# Patient Record
Sex: Female | Born: 1935 | Race: Black or African American | Hispanic: No
Health system: Southern US, Community
[De-identification: ages and names within clinical notes are randomized; demographics above are authoritative.]

## PROBLEM LIST (undated history)

## (undated) DIAGNOSIS — I639 Cerebral infarction, unspecified: Secondary | ICD-10-CM

## (undated) DIAGNOSIS — E041 Nontoxic single thyroid nodule: Secondary | ICD-10-CM

## (undated) DIAGNOSIS — I872 Venous insufficiency (chronic) (peripheral): Secondary | ICD-10-CM

## (undated) DIAGNOSIS — M810 Age-related osteoporosis without current pathological fracture: Secondary | ICD-10-CM

## (undated) DIAGNOSIS — S72001A Fracture of unspecified part of neck of right femur, initial encounter for closed fracture: Secondary | ICD-10-CM

## (undated) DIAGNOSIS — D0590 Unspecified type of carcinoma in situ of unspecified breast: Secondary | ICD-10-CM

## (undated) DIAGNOSIS — N289 Disorder of kidney and ureter, unspecified: Secondary | ICD-10-CM

## (undated) DIAGNOSIS — N19 Unspecified kidney failure: Secondary | ICD-10-CM

## (undated) DIAGNOSIS — I10 Essential (primary) hypertension: Secondary | ICD-10-CM

## (undated) DIAGNOSIS — E785 Hyperlipidemia, unspecified: Secondary | ICD-10-CM

## (undated) DIAGNOSIS — N200 Calculus of kidney: Secondary | ICD-10-CM

## (undated) DIAGNOSIS — F0390 Unspecified dementia without behavioral disturbance: Secondary | ICD-10-CM

## (undated) DIAGNOSIS — N183 Chronic kidney disease, stage 3 (moderate): Secondary | ICD-10-CM

## (undated) HISTORY — DX: Nontoxic single thyroid nodule: E04.1

## (undated) HISTORY — DX: Age-related osteoporosis without current pathological fracture: M81.0

## (undated) HISTORY — DX: Venous insufficiency (chronic) (peripheral): I87.2

## (undated) HISTORY — DX: Unspecified type of carcinoma in situ of unspecified breast: D05.90

## (undated) HISTORY — DX: Calculus of kidney: N20.0

## (undated) HISTORY — DX: Chronic kidney disease, stage 3 (moderate): N18.3

## (undated) HISTORY — DX: Fracture of unspecified part of neck of right femur, initial encounter for closed fracture: S72.001A

---

## 1998-02-12 ENCOUNTER — Other Ambulatory Visit: Admission: RE | Admit: 1998-02-12 | Discharge: 1998-02-12 | Payer: Self-pay | Admitting: Gastroenterology

## 1998-09-20 ENCOUNTER — Other Ambulatory Visit: Admission: RE | Admit: 1998-09-20 | Discharge: 1998-09-20 | Payer: Self-pay | Admitting: Endocrinology

## 1998-12-03 ENCOUNTER — Encounter: Payer: Self-pay | Admitting: Gastroenterology

## 1998-12-03 ENCOUNTER — Inpatient Hospital Stay (HOSPITAL_COMMUNITY): Admission: AD | Admit: 1998-12-03 | Discharge: 1998-12-05 | Payer: Self-pay | Admitting: Gastroenterology

## 1998-12-04 ENCOUNTER — Encounter: Payer: Self-pay | Admitting: Gastroenterology

## 1999-02-25 ENCOUNTER — Encounter: Admission: RE | Admit: 1999-02-25 | Discharge: 1999-02-25 | Payer: Self-pay | Admitting: Gastroenterology

## 1999-02-25 ENCOUNTER — Encounter: Payer: Self-pay | Admitting: Gastroenterology

## 1999-03-18 ENCOUNTER — Encounter (INDEPENDENT_AMBULATORY_CARE_PROVIDER_SITE_OTHER): Payer: Self-pay

## 1999-03-18 ENCOUNTER — Other Ambulatory Visit: Admission: RE | Admit: 1999-03-18 | Discharge: 1999-03-18 | Payer: Self-pay | Admitting: Obstetrics and Gynecology

## 1999-04-08 ENCOUNTER — Encounter: Payer: Self-pay | Admitting: Obstetrics and Gynecology

## 1999-04-08 ENCOUNTER — Encounter: Admission: RE | Admit: 1999-04-08 | Discharge: 1999-04-08 | Payer: Self-pay | Admitting: Obstetrics and Gynecology

## 1999-05-06 ENCOUNTER — Encounter: Payer: Self-pay | Admitting: Obstetrics and Gynecology

## 1999-05-06 ENCOUNTER — Encounter: Admission: RE | Admit: 1999-05-06 | Discharge: 1999-05-06 | Payer: Self-pay | Admitting: Obstetrics and Gynecology

## 2000-02-18 ENCOUNTER — Encounter: Payer: Self-pay | Admitting: Otolaryngology

## 2000-02-18 ENCOUNTER — Ambulatory Visit (HOSPITAL_COMMUNITY): Admission: RE | Admit: 2000-02-18 | Discharge: 2000-02-18 | Payer: Self-pay | Admitting: Otolaryngology

## 2000-07-07 ENCOUNTER — Other Ambulatory Visit: Admission: RE | Admit: 2000-07-07 | Discharge: 2000-07-07 | Payer: Self-pay | Admitting: Gastroenterology

## 2000-10-06 ENCOUNTER — Encounter: Admission: RE | Admit: 2000-10-06 | Discharge: 2000-10-06 | Payer: Self-pay | Admitting: Interventional Cardiology

## 2000-10-06 ENCOUNTER — Encounter: Payer: Self-pay | Admitting: Interventional Cardiology

## 2001-07-12 ENCOUNTER — Other Ambulatory Visit: Admission: RE | Admit: 2001-07-12 | Discharge: 2001-07-12 | Payer: Self-pay | Admitting: Gastroenterology

## 2001-07-19 ENCOUNTER — Encounter: Payer: Self-pay | Admitting: Gastroenterology

## 2001-07-19 ENCOUNTER — Ambulatory Visit (HOSPITAL_COMMUNITY): Admission: RE | Admit: 2001-07-19 | Discharge: 2001-07-19 | Payer: Self-pay | Admitting: Gastroenterology

## 2002-02-16 ENCOUNTER — Encounter: Admission: RE | Admit: 2002-02-16 | Discharge: 2002-02-16 | Payer: Self-pay | Admitting: Gastroenterology

## 2002-02-16 ENCOUNTER — Encounter: Payer: Self-pay | Admitting: Gastroenterology

## 2002-09-28 ENCOUNTER — Other Ambulatory Visit: Admission: RE | Admit: 2002-09-28 | Discharge: 2002-09-28 | Payer: Self-pay | Admitting: Gastroenterology

## 2002-10-03 ENCOUNTER — Encounter: Payer: Self-pay | Admitting: Gastroenterology

## 2002-10-03 ENCOUNTER — Encounter: Admission: RE | Admit: 2002-10-03 | Discharge: 2002-10-03 | Payer: Self-pay | Admitting: Gastroenterology

## 2003-10-16 ENCOUNTER — Other Ambulatory Visit: Admission: RE | Admit: 2003-10-16 | Discharge: 2003-10-16 | Payer: Self-pay

## 2003-11-12 ENCOUNTER — Encounter: Admission: RE | Admit: 2003-11-12 | Discharge: 2003-11-12 | Payer: Self-pay | Admitting: Gastroenterology

## 2004-12-31 ENCOUNTER — Encounter: Admission: RE | Admit: 2004-12-31 | Discharge: 2004-12-31 | Payer: Self-pay | Admitting: Gastroenterology

## 2005-11-03 ENCOUNTER — Other Ambulatory Visit: Admission: RE | Admit: 2005-11-03 | Discharge: 2005-11-03 | Payer: Self-pay | Admitting: Gastroenterology

## 2006-01-03 ENCOUNTER — Encounter: Admission: RE | Admit: 2006-01-03 | Discharge: 2006-01-03 | Payer: Self-pay | Admitting: Gastroenterology

## 2006-11-11 ENCOUNTER — Other Ambulatory Visit: Admission: RE | Admit: 2006-11-11 | Discharge: 2006-11-11 | Payer: Self-pay | Admitting: Gastroenterology

## 2007-02-07 ENCOUNTER — Encounter: Admission: RE | Admit: 2007-02-07 | Discharge: 2007-02-07 | Payer: Self-pay | Admitting: Gastroenterology

## 2007-11-22 ENCOUNTER — Other Ambulatory Visit: Admission: RE | Admit: 2007-11-22 | Discharge: 2007-11-22 | Payer: Self-pay | Admitting: Gastroenterology

## 2007-11-24 ENCOUNTER — Encounter: Admission: RE | Admit: 2007-11-24 | Discharge: 2007-11-24 | Payer: Self-pay | Admitting: Gastroenterology

## 2008-03-08 ENCOUNTER — Encounter: Admission: RE | Admit: 2008-03-08 | Discharge: 2008-03-08 | Payer: Self-pay | Admitting: Gastroenterology

## 2008-09-02 ENCOUNTER — Encounter: Admission: RE | Admit: 2008-09-02 | Discharge: 2008-09-02 | Payer: Self-pay | Admitting: Gastroenterology

## 2009-03-04 ENCOUNTER — Inpatient Hospital Stay (HOSPITAL_COMMUNITY): Admission: EM | Admit: 2009-03-04 | Discharge: 2009-03-11 | Payer: Self-pay | Admitting: Emergency Medicine

## 2010-07-15 LAB — BASIC METABOLIC PANEL
BUN: 19 mg/dL (ref 6–23)
BUN: 26 mg/dL — ABNORMAL HIGH (ref 6–23)
BUN: 31 mg/dL — ABNORMAL HIGH (ref 6–23)
CO2: 22 mEq/L (ref 19–32)
CO2: 25 mEq/L (ref 19–32)
Calcium: 8 mg/dL — ABNORMAL LOW (ref 8.4–10.5)
Calcium: 8.2 mg/dL — ABNORMAL LOW (ref 8.4–10.5)
Calcium: 8.3 mg/dL — ABNORMAL LOW (ref 8.4–10.5)
Chloride: 106 mEq/L (ref 96–112)
Chloride: 110 mEq/L (ref 96–112)
Chloride: 99 mEq/L (ref 96–112)
Creatinine, Ser: 1.62 mg/dL — ABNORMAL HIGH (ref 0.4–1.2)
Creatinine, Ser: 1.98 mg/dL — ABNORMAL HIGH (ref 0.4–1.2)
Creatinine, Ser: 2.03 mg/dL — ABNORMAL HIGH (ref 0.4–1.2)
GFR calc Af Amer: 28 mL/min — ABNORMAL LOW (ref >60)
GFR calc Af Amer: 29 mL/min — ABNORMAL LOW (ref >60)
GFR calc Af Amer: 31 mL/min — ABNORMAL LOW (ref >60)
GFR calc non Af Amer: 23 mL/min — ABNORMAL LOW (ref >60)
GFR calc non Af Amer: 24 mL/min — ABNORMAL LOW (ref >60)
Potassium: 3.7 mEq/L (ref 3.5–5.1)
Potassium: 4.1 mEq/L (ref 3.5–5.1)
Sodium: 138 mEq/L (ref 135–145)

## 2010-07-15 LAB — URINE CULTURE

## 2010-07-15 LAB — COMPREHENSIVE METABOLIC PANEL
ALT: 23 U/L (ref 0–35)
AST: 40 U/L — ABNORMAL HIGH (ref 0–37)
Alkaline Phosphatase: 57 U/L (ref 39–117)
CO2: 26 mEq/L (ref 19–32)
Calcium: 9.3 mg/dL (ref 8.4–10.5)
Chloride: 106 mEq/L (ref 96–112)
GFR calc Af Amer: 32 mL/min — ABNORMAL LOW (ref >60)
GFR calc non Af Amer: 26 mL/min — ABNORMAL LOW (ref >60)
Glucose, Bld: 190 mg/dL — ABNORMAL HIGH (ref 70–99)
Potassium: 4.5 mEq/L (ref 3.5–5.1)
Sodium: 141 mEq/L (ref 135–145)
Total Bilirubin: 0.8 mg/dL (ref 0.3–1.2)

## 2010-07-15 LAB — TSH: TSH: 0.458 u[IU]/mL (ref 0.350–4.500)

## 2010-07-15 LAB — CBC
HCT: 28.9 % — ABNORMAL LOW (ref 36.0–46.0)
Hemoglobin: 11.8 g/dL — ABNORMAL LOW (ref 12.0–15.0)
Hemoglobin: 9.9 g/dL — ABNORMAL LOW (ref 12.0–15.0)
MCHC: 34.5 g/dL (ref 30.0–36.0)
MCHC: 34.9 g/dL (ref 30.0–36.0)
MCV: 96.3 fL (ref 78.0–100.0)
MCV: 96.7 fL (ref 78.0–100.0)
MCV: 97.1 fL (ref 78.0–100.0)
Platelets: 136 10*3/uL — ABNORMAL LOW (ref 150–400)
Platelets: 83 10*3/uL — ABNORMAL LOW (ref 150–400)
RBC: 2.79 MIL/uL — ABNORMAL LOW (ref 3.87–5.11)
RBC: 2.94 MIL/uL — ABNORMAL LOW (ref 3.87–5.11)
RBC: 3 MIL/uL — ABNORMAL LOW (ref 3.87–5.11)
RBC: 3.54 MIL/uL — ABNORMAL LOW (ref 3.87–5.11)
WBC: 12.1 10*3/uL — ABNORMAL HIGH (ref 4.0–10.5)
WBC: 16.1 10*3/uL — ABNORMAL HIGH (ref 4.0–10.5)
WBC: 5.5 10*3/uL (ref 4.0–10.5)
WBC: 9 10*3/uL (ref 4.0–10.5)

## 2010-07-15 LAB — URINALYSIS, ROUTINE W REFLEX MICROSCOPIC
Protein, ur: NEGATIVE mg/dL
Specific Gravity, Urine: 1.013 (ref 1.005–1.030)
Urobilinogen, UA: 1 mg/dL (ref 0.0–1.0)

## 2010-07-15 LAB — URINE MICROSCOPIC-ADD ON

## 2010-07-15 LAB — DIFFERENTIAL
Eosinophils Absolute: 0 10*3/uL (ref 0.0–0.7)
Eosinophils Relative: 0 % (ref 0–5)
Eosinophils Relative: 0 % (ref 0–5)
Lymphocytes Relative: 10 % — ABNORMAL LOW (ref 12–46)
Lymphs Abs: 0.2 10*3/uL — ABNORMAL LOW (ref 0.7–4.0)
Lymphs Abs: 0.9 10*3/uL (ref 0.7–4.0)
Monocytes Absolute: 0.3 10*3/uL (ref 0.1–1.0)
Monocytes Relative: 9 % (ref 3–12)
Neutrophils Relative %: 97 % — ABNORMAL HIGH (ref 43–77)

## 2010-07-15 LAB — HEMOCCULT GUIAC POC 1CARD (OFFICE): Fecal Occult Bld: NEGATIVE

## 2010-10-02 ENCOUNTER — Emergency Department (HOSPITAL_COMMUNITY): Payer: Medicare Other

## 2010-10-02 ENCOUNTER — Inpatient Hospital Stay (HOSPITAL_COMMUNITY)
Admission: EM | Admit: 2010-10-02 | Discharge: 2010-10-06 | DRG: 066 | Disposition: A | Discharge status: Skilled Nursing Facility | Payer: Medicare Other | Attending: Internal Medicine | Admitting: Internal Medicine

## 2010-10-02 ENCOUNTER — Encounter (HOSPITAL_COMMUNITY): Payer: Self-pay | Admitting: Radiology

## 2010-10-02 DIAGNOSIS — Z7902 Long term (current) use of antithrombotics/antiplatelets: Secondary | ICD-10-CM

## 2010-10-02 DIAGNOSIS — H5340 Unspecified visual field defects: Secondary | ICD-10-CM | POA: Diagnosis present

## 2010-10-02 DIAGNOSIS — R5381 Other malaise: Secondary | ICD-10-CM | POA: Diagnosis present

## 2010-10-02 DIAGNOSIS — Z9181 History of falling: Secondary | ICD-10-CM

## 2010-10-02 DIAGNOSIS — I10 Essential (primary) hypertension: Secondary | ICD-10-CM | POA: Diagnosis present

## 2010-10-02 DIAGNOSIS — E785 Hyperlipidemia, unspecified: Secondary | ICD-10-CM | POA: Diagnosis present

## 2010-10-02 DIAGNOSIS — K5900 Constipation, unspecified: Secondary | ICD-10-CM | POA: Diagnosis present

## 2010-10-02 DIAGNOSIS — I6350 Cerebral infarction due to unspecified occlusion or stenosis of unspecified cerebral artery: Principal | ICD-10-CM | POA: Diagnosis present

## 2010-10-02 HISTORY — DX: Essential (primary) hypertension: I10

## 2010-10-02 LAB — COMPREHENSIVE METABOLIC PANEL
BUN: 21 mg/dL (ref 6–23)
Calcium: 9.5 mg/dL (ref 8.4–10.5)
GFR calc Af Amer: 46 mL/min — ABNORMAL LOW (ref >60)
Glucose, Bld: 94 mg/dL (ref 70–99)
Total Protein: 6.8 g/dL (ref 6.0–8.3)

## 2010-10-02 LAB — URINALYSIS, ROUTINE W REFLEX MICROSCOPIC
Bilirubin Urine: NEGATIVE
Glucose, UA: NEGATIVE mg/dL
Hgb urine dipstick: NEGATIVE
Ketones, ur: NEGATIVE mg/dL
Nitrite: NEGATIVE
Protein, ur: NEGATIVE mg/dL
Specific Gravity, Urine: 1.013 (ref 1.005–1.030)
Urobilinogen, UA: 1 mg/dL (ref 0.0–1.0)
pH: 7 (ref 5.0–8.0)

## 2010-10-02 LAB — GLUCOSE, CAPILLARY: Glucose-Capillary: 78 mg/dL (ref 70–99)

## 2010-10-02 LAB — CBC
HCT: 31.1 % — ABNORMAL LOW (ref 36.0–46.0)
Hemoglobin: 11.1 g/dL — ABNORMAL LOW (ref 12.0–15.0)
MCH: 32.1 pg (ref 26.0–34.0)
MCHC: 35.7 g/dL (ref 30.0–36.0)
MCV: 89.9 fL (ref 78.0–100.0)
Platelets: 150 K/uL (ref 150–400)
RBC: 3.46 MIL/uL — ABNORMAL LOW (ref 3.87–5.11)
RDW: 12.8 % (ref 11.5–15.5)
WBC: 4.7 10*3/uL (ref 4.0–10.5)

## 2010-10-02 LAB — COMPREHENSIVE METABOLIC PANEL WITH GFR
ALT: 14 U/L (ref 0–35)
AST: 23 U/L (ref 0–37)
Albumin: 3.2 g/dL — ABNORMAL LOW (ref 3.5–5.2)
Alkaline Phosphatase: 60 U/L (ref 39–117)
CO2: 27 meq/L (ref 19–32)
Chloride: 107 meq/L (ref 96–112)
Creatinine, Ser: 1.33 mg/dL — ABNORMAL HIGH (ref 0.50–1.10)
GFR calc non Af Amer: 38 mL/min — ABNORMAL LOW (ref >60)
Potassium: 4.3 meq/L (ref 3.5–5.1)
Sodium: 141 meq/L (ref 135–145)
Total Bilirubin: 0.6 mg/dL (ref 0.3–1.2)

## 2010-10-02 LAB — DIFFERENTIAL
Basophils Absolute: 0 10*3/uL (ref 0.0–0.1)
Basophils Relative: 0 % (ref 0–1)
Eosinophils Absolute: 0.1 K/uL (ref 0.0–0.7)
Eosinophils Relative: 1 % (ref 0–5)
Lymphocytes Relative: 32 % (ref 12–46)
Lymphs Abs: 1.5 K/uL (ref 0.7–4.0)
Monocytes Absolute: 0.4 10*3/uL (ref 0.1–1.0)
Monocytes Relative: 9 % (ref 3–12)
Neutro Abs: 2.7 10*3/uL (ref 1.7–7.7)
Neutrophils Relative %: 58 % (ref 43–77)

## 2010-10-02 LAB — CK TOTAL AND CKMB (NOT AT ARMC)
CK, MB: 3.7 ng/mL (ref 0.3–4.0)
Relative Index: 2.7 — ABNORMAL HIGH (ref 0.0–2.5)
Total CK: 136 U/L (ref 7–177)

## 2010-10-02 LAB — URINE MICROSCOPIC-ADD ON

## 2010-10-02 LAB — PROTIME-INR
INR: 1.16 (ref 0.00–1.49)
Prothrombin Time: 15 seconds (ref 11.6–15.2)

## 2010-10-02 LAB — TROPONIN I: Troponin I: <0.3 ng/mL (ref <0.30)

## 2010-10-02 LAB — APTT: aPTT: 29 seconds (ref 24–37)

## 2010-10-03 LAB — BASIC METABOLIC PANEL
BUN: 21 mg/dL (ref 6–23)
Creatinine, Ser: 1.27 mg/dL — ABNORMAL HIGH (ref 0.50–1.10)
GFR calc Af Amer: 48 mL/min — ABNORMAL LOW (ref >60)
GFR calc non Af Amer: 40 mL/min — ABNORMAL LOW (ref >60)
Potassium: 3.9 mEq/L (ref 3.5–5.1)

## 2010-10-03 LAB — CBC
HCT: 28.5 % — ABNORMAL LOW (ref 36.0–46.0)
MCHC: 35.8 g/dL (ref 30.0–36.0)
RDW: 12.8 % (ref 11.5–15.5)

## 2010-10-03 LAB — HEMOGLOBIN A1C: Hgb A1c MFr Bld: 5.8 % — ABNORMAL HIGH (ref <5.7)

## 2010-10-03 LAB — DIFFERENTIAL
Basophils Absolute: 0 10*3/uL (ref 0.0–0.1)
Eosinophils Relative: 2 % (ref 0–5)
Lymphocytes Relative: 34 % (ref 12–46)
Monocytes Absolute: 0.5 10*3/uL (ref 0.1–1.0)

## 2010-10-03 LAB — GLUCOSE, CAPILLARY
Glucose-Capillary: 88 mg/dL (ref 70–99)
Glucose-Capillary: 77 mg/dL (ref 70–99)

## 2010-10-03 LAB — LIPID PANEL: HDL: 65 mg/dL (ref >39)

## 2010-10-04 LAB — GLUCOSE, CAPILLARY: Glucose-Capillary: 125 mg/dL — ABNORMAL HIGH (ref 70–99)

## 2010-10-04 LAB — CBC
HCT: 29.6 % — ABNORMAL LOW (ref 36.0–46.0)
Hemoglobin: 10.5 g/dL — ABNORMAL LOW (ref 12.0–15.0)
MCH: 31.5 pg (ref 26.0–34.0)
MCHC: 35.5 g/dL (ref 30.0–36.0)
MCV: 88.9 fL (ref 78.0–100.0)

## 2010-10-04 LAB — BASIC METABOLIC PANEL
BUN: 21 mg/dL (ref 6–23)
Calcium: 8.7 mg/dL (ref 8.4–10.5)
Creatinine, Ser: 1.29 mg/dL — ABNORMAL HIGH (ref 0.50–1.10)
GFR calc non Af Amer: 39 mL/min — ABNORMAL LOW (ref >60)
Glucose, Bld: 107 mg/dL — ABNORMAL HIGH (ref 70–99)

## 2010-10-05 ENCOUNTER — Inpatient Hospital Stay (HOSPITAL_COMMUNITY): Payer: Medicare Other

## 2010-10-05 LAB — BASIC METABOLIC PANEL
GFR calc non Af Amer: 47 mL/min — ABNORMAL LOW (ref >60)
Glucose, Bld: 90 mg/dL (ref 70–99)
Potassium: 4 mEq/L (ref 3.5–5.1)
Sodium: 141 mEq/L (ref 135–145)

## 2010-10-05 LAB — GLUCOSE, CAPILLARY: Glucose-Capillary: 98 mg/dL (ref 70–99)

## 2010-10-05 LAB — CBC
Hemoglobin: 9.7 g/dL — ABNORMAL LOW (ref 12.0–15.0)
MCHC: 35.7 g/dL (ref 30.0–36.0)
RBC: 3.05 MIL/uL — ABNORMAL LOW (ref 3.87–5.11)
WBC: 3.8 10*3/uL — ABNORMAL LOW (ref 4.0–10.5)

## 2010-10-05 NOTE — H&P (Signed)
Jacqueline Mccann, Jacqueline Mccann           ACCOUNT NO.:  [id number]  MEDICAL RECORD NO.:  [id number]  LOCATION:  [facility]                         FACILITY:  [facility]  PHYSICIAN:  Jeoffrey Massed, MD    DATE OF BIRTH:  10-08-1919  DATE OF ADMISSION:  10/02/2010 DATE OF DISCHARGE:                             HISTORY & PHYSICAL   PRIMARY CARE PRACTITIONER:  Jacqueline Acres. White, MD  CHIEF COMPLAINT:  Confusion and visual problems.  HISTORY OF PRESENT ILLNESS:  The patient is a very pleasant 77 year old female who was brought to the ED for the above-noted complaints.  Please note that this patient has a past medical history of hypertension and dyslipidemia.  Apparently, the patient stumbled and fell backwards 2 days ago, striking her head on the table.  Apparently, the patient was attempting to sit on a chair and she lost balance.  There was no syncopal episode.  The patient appears slightly confused that day and as a result, the patient to primary care practitioner, who evaluated her and sent her back home.  Over the past day or so, the patient's family has noted that the patient was acting slightly confused and not behaving appropriately.  Apparently, this patient lives alone, but the patient's son and daughter check on her almost on a daily basis.  The patient claims that she keeps on bumping into things on the left side.  She claims she cannot see out of her left eye as good as she can see out of her right eye.  In any event, because of the ongoing confusion, the patient was brought to the ED where a CT of the head done showed multiple small cerebral Mccann matter infarcts more prominent on the right than the left.  The Hospitalist Service now been asked to admit her to the hospital for further evaluation and treatment.  The patient claims that she does get on and off left-sided chest pain, however, this chest pain is reproducible on palpation.  The patient denies any shortness of breath, nausea,  vomiting, diarrhea.  She claims to have intermittent mild headaches.  ALLERGIES:  No known drug allergies.  PAST MEDICAL HISTORY:  Hypertension and hyperlipidemia.  PAST SURGICAL HISTORY: 1. Bilateral knee replacement. 2. Laparotomy for unknown etiology many years ago.  Medications at home include the following: 1. Metoprolol 50 mg p.o. daily. 2. Aspirin 81 mg 1 tablet daily. 3. Xalatan 0.005% 1 drop in both eyes daily at bedtime. 4. Simvastatin 20 mg daily. 5. Meloxicam 7.5 mg 1 tablet daily. 6. Meclizine 25 mg 1 tablet every 4-6 hours. 7. Losartan 100 mg 1 tablet daily.  FAMILY HISTORY:  Noncontributory.  SOCIAL HISTORY:  Lives alone and denies any toxic habits.  REVIEW OF SYSTEMS:  A detailed review of 12 systems was done and these are negative except for the ones mentioned in the HPI.  PHYSICAL EXAMINATION:  GENERAL:  Lying in bed, does not appear to be in any distress.  Speech is clear. VITAL SIGNS:  Temperature 98.7, heart rate of 63, blood pressure 160/70, respiration of 18, and a pulse ox of 98% on room air. HEENT:  Atraumatic, normocephalic.  Pupils equally reactive to light and recommendation. NECK:  Supple. CHEST:  Bilaterally clear to auscultation. CARDIOVASCULAR:  Heart sounds are regular.  No murmurs heard. ABDOMEN:  Soft, nontender, nondistended. EXTREMITIES:  No edema. NEUROLOGIC:  Upon further examination, the patient does appear to have left hemianopsia.  As noted above, speech is clear.  Rest of the cranial nerves are intact grossly.  She has around 5/5 strength in all muscle groups.  Questionable very mild left facial droop.  LABORATORY STUDIES: 1. CBC shows a WBC of 4.7, hemoglobin of 11.1, hematocrit of 31.1, and     a platelet count of 150. 2. INR is 1.16. 3. First set of troponin was negative. 4. Chemistry shows sodium of 141, potassium of 4.3, chloride of 107,     bicarb of 27, glucose of 94, BUN of 21, creatinine of 1.33, and a     calcium  of 9.5. 5. LFT shows a total bilirubin of 0.6, alkaline phosphatase of 60, AST     of 23, ALT of 14, total protein of 6.8, and albumin of 3.2. 6. Urinalysis is negative.  RADIOLOGICAL STUDIES: 1. CT of the head showed multiple small cerebral Mccann matter     infarcts, more prominent on the right than the left. 2. MRI of the brain showed acute/subacute nonhemorrhagic infarct of     the posterior right parietal lobe and posterior right frontal lobe     as seen on the CT scan.  Moderate generalized atrophy.  Mild     periventricular Mccann change. 3. MRA of the head showed focal narrowing of the right M1 segment and     appears to be less than 50%.  Mild atherosclerotic changes within     the cavernous and supraclinoid internal carotid arteries     bilaterally.  Small vessel disease.  Atherosclerotic irregularity     throughout the left posterior cerebral artery without a focal     stenosis. 4. EKG shows normal sinus rhythm.  ASSESSMENT: 1. Acute and subacute cerebrovascular accident. 2. Hypertension. 3. Dyslipidemia. 4. Mild renal insufficiency.  PLAN: 1. The patient will be admitted to the Neuro Telemetry Unit. 2. She will be given a full-dose aspirin. 3. Stroke workup will be initiated. 4. ED physician has already consulted Neurology and we will await     their assessment as well. 5. She will be gently hydrated. 6. We will continue her simvastatin. 7. We will allow for some permissive hypertension and we will use     hydralazine for now to control her blood pressure, if her systolic     blood pressure is more than 160 or 165. 8. Further plan will depend as patient's clinical course evolves. 9. DVT prophylaxis with Lovenox. 10.Code status full code.  Total time spent 65 minutes.     Jeoffrey Massed, MD     SG/MEDQ  D:  10/02/2010  T:  10/02/2010  Job:  [id number]  Electronically Signed by Jeoffrey Massed  on 10/05/2010 04:28:17 PM

## 2010-10-06 DIAGNOSIS — I6330 Cerebral infarction due to thrombosis of unspecified cerebral artery: Secondary | ICD-10-CM

## 2010-10-06 LAB — GLUCOSE, CAPILLARY: Glucose-Capillary: 124 mg/dL — ABNORMAL HIGH (ref 70–99)

## 2010-10-15 NOTE — Discharge Summary (Signed)
[name], [name]           ACCOUNT NO.:  [id number]  MEDICAL RECORD NO.:  [id number]  LOCATION:  3008                         FACILITY:  [facility]  PHYSICIAN:  Jacqueline Ranger, MD       DATE OF BIRTH:  June 25, 1919  DATE OF ADMISSION:  10/02/2010 DATE OF DISCHARGE:                              DISCHARGE SUMMARY   PRIMARY CARE PHYSICIAN:  Stacie Acres. White, MD  DISCHARGE DIAGNOSES: 1. Acute and subacute cerebrovascular accident. 2. Hypertension. 3. Hyperlipidemia. 4. Mild acute renal insufficiency. 5. Deconditioning with left visual field deficits, requiring skilled     nursing facility placement. 6. Constipation.  CONSULTATION:  Neurology, Pramod P. Pearlean Brownie, MD  DISCHARGE MEDICATIONS: 1. Amlodipine 5 mg p.o. daily. 2. Plavix 75 mg p.o. daily. 3. MiraLax 17 g p.o. daily. 4. Meclizine 25 mg every 6 hours as needed for dizziness. 5. Losartan 100 mg p.o. daily. 6. Meloxicam 7.5 mg p.o. daily. 7. Metoprolol succinate 50 mg p.o. daily. 8. Simvastatin 20 mg p.o. daily. 9. Xalatan eye drops 0.005% 1 drop in both eyes daily at bedtime.  BRIEF HISTORY OF PRESENT ILLNESS:  At the time of admission, Ms. Jacqueline Mccann is an 77 year old female, who was brought to the emergency room with confusion and visual problems.  Apparently, the patient stumbled and fell backwards 2 days prior to admission, striking her head on the table.  She was attempting to sit on a chair and off balance. Otherwise, there was no syncopal episode.  The patient appeared to be slightly confused that day and went to her primary care practitioner. Over the past day or so, the patient's family noticed that she was acting more confused and not behaving appropriately and she was bumping into things on the left side.  The patient claimed that she could not see out of her left eye as good that as she can see out of her right eye.  RADIOLOGICAL DATA:  Chest x-ray 2-view on October 02, 2010, stable mild cardiomegaly.  No  acute findings.  CT head without contrast on October 02, 2010, mild small cerebral white matter infarcts, more prominent on the right than on the left, age indeterminate.  MRI of the head showed acute/ subacute nonhemorrhagic infarcts of the posterior right parietal lobe and posterior right frontal lobe as seen on the CT scan, mild generalize atrophia, mild periventricular white matter changes, reflexes sequelae of the chronic microvascular ischemia.  MRA; 1. Focal narrowing of the right M1 segment appears to be less than     50%. 2. Mild atherosclerotic changes within the clonus, supraglenoid, and     internal carotid arteries bilaterally. 3. Small-vessel disease. 4. Atherosclerotic irregularity throughout the left posterior cerebral     artery without a focal stenosis.  Abdominal x-ray on October 05, 2010,     nonobstructive bowel gas pattern.  A 2-D echocardiogram October 03, 2010, showed EF of 60% to 65%.  Normal wall motion.  No regional     wall motion abnormalities. 5. Grade 2 diastolic dysfunction.  TEE was done today, June 18m 2012,     which showed EF of 55% to 60%.  No evidence of any vegetation.  No  right-to-left atrial level shunts.  No defect or patent foremen     ovale was identified.  Aortic arch atheroma mild could be potential     source of cerebral embolism.  Vascular carotid Dopplers on October 03, 2010, showed no significant ICA stenosis.  BRIEF HOSPITALIZATION COURSE:  Ms. Steinkamp is an 77 year old female who was admitted with acute CVA. 1. Acute ischemic infarction in the right middle cerebral artery     territory.  The patient was admitted to the neurology floor.  A     Neurology consultation was obtained.  The patient underwent     complete stroke workup for details please refer to the above     details.  As the patient was already on aspirin.  Per Neurology     recommendation, she was switched to Plavix 75 mg daily.  Statin was     continued.  The patient  underwent TEE today, which was essentially     unremarkable except aortic arch atheroma (mild could be potential     source of cerebral embolism).  The patient passed the swallow     evaluation and she is currently tolerating diet well.  Physical     therapy evaluation was obtained and recommended skilled nursing     facility placement at this time.  PHYSICAL EXAMINATION:  VITAL SIGNS: At the time of dictation, BP 154/57, temperature 98.2, pulse 67, respirations 18, and O2 sats 10% om room air. GENERAL:  The patient is alert, awake, and oriented.  Does not appear to be in any distress. CARDIOVASCULAR SYSTEM:  S1 and S2.  Chest clear to auscultation bilaterally. ABDOMEN  Soft, nontender, and nondistended.  Normal bowel sounds. EXTREMITIES: No cyanosis, clubbing, or edema noted in upper or lower extremities bilaterally.  DISCHARGE PLAN:  Followup with Dr. Delia Heady, in 2 months and Dr. Laurann Montana, in 2 to 3 weeks 3.  DISCHARGE TIME:  35 minutes.     Jacqueline Ranger, MD     RR/MEDQ  D:  10/06/2010  T:  10/06/2010  Job:  [id number]  cc:   Stacie Acres. Cliffton Asters, M.D. Pramod P. Pearlean Brownie, MD  Electronically Signed by Andres Labrum Valora Norell  on 10/15/2010 02:01:03 PM

## 2010-10-28 NOTE — Consult Note (Signed)
NAMEAASHRITHA, MIEDEMA           ACCOUNT NO.:  [id number]  MEDICAL RECORD NO.:  [id number]  LOCATION:  3008                         FACILITY:  [facility]  PHYSICIAN:  Joycelyn Schmid, MD   DATE OF BIRTH:  Apr 07, 1920  DATE OF CONSULTATION:  10/03/2010 DATE OF DISCHARGE:                                CONSULTATION   CHIEF COMPLAINT:  Dizziness, vision changes, right brain stroke.  HISTORY OF PRESENT ILLNESS:  An 77 year old female with hypertension, hyperlipidemia who was in normal state of health until yesterday when she woke up and had visual difficulty.  She is having difficulty seeing towards the left side.  She was brought to [facility] for further evaluation.  CT scan of the head showed wedge-shaped hypodensities in the right frontal and right posterior temporal parietal region.  MRI scan of the brain confirmed acute ischemic infarctions in the right posterior frontal and right posterior temporal parietal regions.  The patient admitted for further stroke workup.  PAST MEDICAL HISTORY:  Hypertension and hyperlipidemia.  MEDICATIONS:  Metoprolol, aspirin 81 mg per day, Xalatan eyedrops, simvastatin, Meloxicam, meclizine, and losartan.  ALLERGIES:  No known drug allergies.  FAMILY HISTORY:  None.  SOCIAL HISTORY:  No tobacco, alcohol or illicit drug use.  The patient had been living alone with her son and daughter checking upon her on a daily basis.  REVIEW OF SYSTEMS:  As per the HPI.  She did fall several days ago, otherwise, has been feeling normal.  PHYSICAL EXAMINATION:  VITAL SIGNS:  Blood pressure 134/67, pulse of 59, respirations 18, O2 sat 96% on room air. GENERAL:  She is awake and alert.  Language is fluent.  Comprehension intact. NEURO:  Cranial nerve examination:  Pupils are reactive to 2 mm.  She has a left field cut upper and lower quadrants.  She has right gaze preference.  She is not able to move her eyes past midline to the left. Facial  sensation symmetric, decreased left lower facial strength.  Uvula is midline.  Shoulder shrug is symmetric.  Tongue is midline.  Motor examination:  Normal bulk and tone in bilateral upper and lower extremities, 5/5 strength.  Sensory examination intact to light touch and temperature.  Vibration in upper extremities she has absent vibration at the toes bilaterally.  Pinprick sensation symmetric.  She has some mild left neglect.  Coordination testing finger-nose-finger smooth and symmetric, slightly slow on the left side.  Reflexes 2+ in the upper and lower extremities, trace at the ankles.  Downgoing toes. CARDIOVASCULAR:  Bradycardic, regular rhythm.  No murmurs, no carotid bruits.  LABORATORY TESTING:  Sodium 141, BUN 21, creatinine 1.33.  White count 4.7, platelets 150.  PT 15.0, INR 1.16.  MRI scan of the brain which I reviewed shows acute ischemic infarctions with bulk appearing in the right posterior frontal and right posterior temporal parietal region. There is mild diffuse atrophy.  MRI of the head shows atherosclerosis in the right M1 segment of the middle cerebral artery.  IMPRESSION:  An 77 year old female with two wedge-shaped acute ischemic infarctions in the right middle cerebral artery territory possibly due to artery-to-artery embolism, right middle cerebral artery stenosis or cardioembolic source.  RECOMMENDATIONS: 1. Check  carotid ultrasound, transthoracic echocardiogram.  If TTE is     negative would consider transesophageal echocardiogram after that. 2. Check fasting lipid profile.  Follow up blood glucose checks. 3. Recommend antiplatelet, consider switching aspirin to Plavix 75 mg     per day. 4. To continue statin. 5. Permissive hypertension for 24 hours been gradually reduced. 6. Continue cardiac telemetry monitoring. 7. N.p.o. until swallow evaluation. 8. PT and OT evaluation. 9. DVT prophylaxis.     Joycelyn Schmid, MD     VP/MEDQ  D:   10/03/2010  T:  10/03/2010  Job:  [id number]  Electronically Signed by Joycelyn Schmid  on 10/28/2010 10:21:32 PM

## 2011-01-11 ENCOUNTER — Other Ambulatory Visit: Payer: Self-pay | Admitting: Neurology

## 2011-01-11 DIAGNOSIS — G44009 Cluster headache syndrome, unspecified, not intractable: Secondary | ICD-10-CM

## 2011-01-15 ENCOUNTER — Ambulatory Visit
Admission: RE | Admit: 2011-01-15 | Discharge: 2011-01-15 | Disposition: A | Discharge status: Home / Self Care | Payer: Medicare Other | Source: Ambulatory Visit | Attending: Neurology | Admitting: Neurology

## 2011-01-15 ENCOUNTER — Other Ambulatory Visit: Payer: Self-pay | Admitting: Neurology

## 2011-01-15 DIAGNOSIS — G44009 Cluster headache syndrome, unspecified, not intractable: Secondary | ICD-10-CM

## 2011-11-12 ENCOUNTER — Ambulatory Visit
Admission: RE | Admit: 2011-11-12 | Discharge: 2011-11-12 | Disposition: A | Discharge status: Home / Self Care | Payer: Medicare Other | Source: Ambulatory Visit | Attending: Family Medicine | Admitting: Family Medicine

## 2011-11-12 ENCOUNTER — Other Ambulatory Visit: Payer: Self-pay | Admitting: Family Medicine

## 2011-11-12 DIAGNOSIS — R42 Dizziness and giddiness: Secondary | ICD-10-CM

## 2013-01-24 ENCOUNTER — Emergency Department (HOSPITAL_COMMUNITY): Payer: PRIVATE HEALTH INSURANCE

## 2013-01-24 ENCOUNTER — Inpatient Hospital Stay (HOSPITAL_COMMUNITY)
Admission: EM | Admit: 2013-01-24 | Discharge: 2013-01-29 | DRG: 481 | Disposition: A | Discharge status: Skilled Nursing Facility | Payer: PRIVATE HEALTH INSURANCE | Attending: Internal Medicine | Admitting: Internal Medicine

## 2013-01-24 ENCOUNTER — Encounter (HOSPITAL_COMMUNITY): Payer: Self-pay | Admitting: Emergency Medicine

## 2013-01-24 DIAGNOSIS — S72143A Displaced intertrochanteric fracture of unspecified femur, initial encounter for closed fracture: Principal | ICD-10-CM | POA: Diagnosis present

## 2013-01-24 DIAGNOSIS — S72009A Fracture of unspecified part of neck of unspecified femur, initial encounter for closed fracture: Secondary | ICD-10-CM

## 2013-01-24 DIAGNOSIS — I10 Essential (primary) hypertension: Secondary | ICD-10-CM

## 2013-01-24 DIAGNOSIS — S72001A Fracture of unspecified part of neck of right femur, initial encounter for closed fracture: Secondary | ICD-10-CM

## 2013-01-24 DIAGNOSIS — E785 Hyperlipidemia, unspecified: Secondary | ICD-10-CM | POA: Diagnosis present

## 2013-01-24 DIAGNOSIS — F0151 Vascular dementia with behavioral disturbance: Secondary | ICD-10-CM

## 2013-01-24 DIAGNOSIS — D696 Thrombocytopenia, unspecified: Secondary | ICD-10-CM | POA: Diagnosis not present

## 2013-01-24 DIAGNOSIS — Z79899 Other long term (current) drug therapy: Secondary | ICD-10-CM

## 2013-01-24 DIAGNOSIS — F0390 Unspecified dementia without behavioral disturbance: Secondary | ICD-10-CM | POA: Diagnosis present

## 2013-01-24 DIAGNOSIS — W19XXXA Unspecified fall, initial encounter: Secondary | ICD-10-CM | POA: Diagnosis present

## 2013-01-24 DIAGNOSIS — N179 Acute kidney failure, unspecified: Secondary | ICD-10-CM

## 2013-01-24 DIAGNOSIS — Z8673 Personal history of transient ischemic attack (TIA), and cerebral infarction without residual deficits: Secondary | ICD-10-CM

## 2013-01-24 DIAGNOSIS — N189 Chronic kidney disease, unspecified: Secondary | ICD-10-CM | POA: Diagnosis present

## 2013-01-24 DIAGNOSIS — Y9210 Unspecified residential institution as the place of occurrence of the external cause: Secondary | ICD-10-CM | POA: Diagnosis present

## 2013-01-24 DIAGNOSIS — Z7902 Long term (current) use of antithrombotics/antiplatelets: Secondary | ICD-10-CM

## 2013-01-24 DIAGNOSIS — F01518 Vascular dementia, unspecified severity, with other behavioral disturbance: Secondary | ICD-10-CM

## 2013-01-24 DIAGNOSIS — Z7982 Long term (current) use of aspirin: Secondary | ICD-10-CM

## 2013-01-24 DIAGNOSIS — R339 Retention of urine, unspecified: Secondary | ICD-10-CM | POA: Diagnosis not present

## 2013-01-24 DIAGNOSIS — Z96659 Presence of unspecified artificial knee joint: Secondary | ICD-10-CM

## 2013-01-24 DIAGNOSIS — I129 Hypertensive chronic kidney disease with stage 1 through stage 4 chronic kidney disease, or unspecified chronic kidney disease: Secondary | ICD-10-CM | POA: Diagnosis present

## 2013-01-24 DIAGNOSIS — D649 Anemia, unspecified: Secondary | ICD-10-CM | POA: Diagnosis present

## 2013-01-24 HISTORY — DX: Disorder of kidney and ureter, unspecified: N28.9

## 2013-01-24 HISTORY — DX: Unspecified kidney failure: N19

## 2013-01-24 HISTORY — DX: Hyperlipidemia, unspecified: E78.5

## 2013-01-24 HISTORY — DX: Fracture of unspecified part of neck of right femur, initial encounter for closed fracture: S72.001A

## 2013-01-24 HISTORY — DX: Cerebral infarction, unspecified: I63.9

## 2013-01-24 HISTORY — DX: Unspecified dementia, unspecified severity, without behavioral disturbance, psychotic disturbance, mood disturbance, and anxiety: F03.90

## 2013-01-24 LAB — CBC WITH DIFFERENTIAL/PLATELET
Basophils Absolute: 0 10*3/uL (ref 0.0–0.1)
Basophils Relative: 0 % (ref 0–1)
Eosinophils Absolute: 0 10*3/uL (ref 0.0–0.7)
Eosinophils Relative: 0 % (ref 0–5)
HCT: 30.3 % — ABNORMAL LOW (ref 36.0–46.0)
Lymphocytes Relative: 8 % — ABNORMAL LOW (ref 12–46)
MCH: 30.8 pg (ref 26.0–34.0)
MCHC: 34.7 g/dL (ref 30.0–36.0)
MCV: 88.9 fL (ref 78.0–100.0)
Monocytes Absolute: 0.8 10*3/uL (ref 0.1–1.0)
RDW: 13.4 % (ref 11.5–15.5)
WBC: 10.7 10*3/uL — ABNORMAL HIGH (ref 4.0–10.5)

## 2013-01-24 LAB — BASIC METABOLIC PANEL
BUN: 57 mg/dL — ABNORMAL HIGH (ref 6–23)
CO2: 23 mEq/L (ref 19–32)
Calcium: 9.3 mg/dL (ref 8.4–10.5)
Chloride: 108 mEq/L (ref 96–112)
Creatinine, Ser: 1.9 mg/dL — ABNORMAL HIGH (ref 0.50–1.10)
Glucose, Bld: 197 mg/dL — ABNORMAL HIGH (ref 70–99)
Potassium: 4.9 mEq/L (ref 3.5–5.1)
Sodium: 142 mEq/L (ref 135–145)

## 2013-01-24 LAB — URINALYSIS, ROUTINE W REFLEX MICROSCOPIC
Bilirubin Urine: NEGATIVE
Glucose, UA: NEGATIVE mg/dL
Leukocytes, UA: NEGATIVE
Protein, ur: NEGATIVE mg/dL
Specific Gravity, Urine: 1.013 (ref 1.005–1.030)
Urobilinogen, UA: 0.2 mg/dL (ref 0.0–1.0)

## 2013-01-24 LAB — ABO/RH: ABO/RH(D): B POS

## 2013-01-24 MED ORDER — MORPHINE SULFATE 4 MG/ML IJ SOLN
4.0000 mg | INTRAMUSCULAR | Status: AC | PRN
  Administered 2013-01-24 (×2): 4 mg via INTRAVENOUS
  Filled 2013-01-24 (×2): qty 1

## 2013-01-24 MED ORDER — MECLIZINE HCL 25 MG PO TABS
25.0000 mg | ORAL_TABLET | Freq: Three times a day (TID) | ORAL | Status: DC | PRN
  Filled 2013-01-24: qty 1

## 2013-01-24 MED ORDER — SIMVASTATIN 20 MG PO TABS
20.0000 mg | ORAL_TABLET | Freq: Every evening | ORAL | Status: DC
  Administered 2013-01-24 – 2013-01-29 (×5): 20 mg via ORAL
  Filled 2013-01-24 (×6): qty 1

## 2013-01-24 MED ORDER — TIMOLOL MALEATE 0.25 % OP SOLN
1.0000 [drp] | Freq: Two times a day (BID) | OPHTHALMIC | Status: DC
  Administered 2013-01-24 – 2013-01-29 (×9): 1 [drp] via OPHTHALMIC
  Filled 2013-01-24: qty 5

## 2013-01-24 MED ORDER — DORZOLAMIDE HCL 2 % OP SOLN
1.0000 [drp] | Freq: Two times a day (BID) | OPHTHALMIC | Status: DC
  Administered 2013-01-24 – 2013-01-29 (×9): 1 [drp] via OPHTHALMIC
  Filled 2013-01-24: qty 10

## 2013-01-24 MED ORDER — ENOXAPARIN SODIUM 30 MG/0.3ML ~~LOC~~ SOLN
30.0000 mg | SUBCUTANEOUS | Status: DC
  Filled 2013-01-24 (×2): qty 0.3

## 2013-01-24 MED ORDER — METOPROLOL SUCCINATE ER 25 MG PO TB24
25.0000 mg | ORAL_TABLET | Freq: Every day | ORAL | Status: DC
  Administered 2013-01-24 – 2013-01-29 (×6): 25 mg via ORAL
  Filled 2013-01-24 (×6): qty 1

## 2013-01-24 MED ORDER — LATANOPROST 0.005 % OP SOLN
1.0000 [drp] | Freq: Every day | OPHTHALMIC | Status: DC
  Administered 2013-01-24 – 2013-01-29 (×4): 1 [drp] via OPHTHALMIC
  Filled 2013-01-24: qty 2.5

## 2013-01-24 MED ORDER — AMLODIPINE BESYLATE 5 MG PO TABS: 5.0000 mg | ORAL_TABLET | Freq: Every day | ORAL | Status: DC

## 2013-01-24 MED ORDER — OXYCODONE HCL 5 MG PO TABS: 5.0000 mg | ORAL_TABLET | ORAL | Status: DC | PRN

## 2013-01-24 MED ORDER — SODIUM CHLORIDE 0.9 % IV SOLN
1000.0000 mL | INTRAVENOUS | Status: DC
  Administered 2013-01-24: 1000 mL via INTRAVENOUS

## 2013-01-24 MED ORDER — AMLODIPINE BESYLATE 5 MG PO TABS
5.0000 mg | ORAL_TABLET | Freq: Every day | ORAL | Status: DC
  Administered 2013-01-24 – 2013-01-28 (×4): 5 mg via ORAL
  Filled 2013-01-24 (×6): qty 1

## 2013-01-24 MED ORDER — SODIUM CHLORIDE 0.9 % IV SOLN
1000.0000 mL | Freq: Once | INTRAVENOUS | Status: AC
  Administered 2013-01-24: 1000 mL via INTRAVENOUS

## 2013-01-24 MED ORDER — POLYETHYLENE GLYCOL 3350 17 G PO PACK
17.0000 g | PACK | Freq: Every day | ORAL | Status: DC
  Administered 2013-01-26 – 2013-01-29 (×4): 17 g via ORAL
  Filled 2013-01-24 (×5): qty 1

## 2013-01-24 MED ORDER — ONDANSETRON HCL 4 MG/2ML IJ SOLN: 4.0000 mg | Freq: Four times a day (QID) | INTRAMUSCULAR | Status: DC | PRN

## 2013-01-24 MED ORDER — HYDROCODONE-ACETAMINOPHEN 5-325 MG PO TABS
1.0000 | ORAL_TABLET | Freq: Four times a day (QID) | ORAL | Status: DC | PRN
  Administered 2013-01-26: 1 via ORAL
  Filled 2013-01-24: qty 1

## 2013-01-24 MED ORDER — MORPHINE SULFATE 2 MG/ML IJ SOLN: 0.5000 mg | INTRAMUSCULAR | Status: DC | PRN

## 2013-01-24 MED ORDER — ENSURE COMPLETE PO LIQD
237.0000 mL | Freq: Two times a day (BID) | ORAL | Status: DC
  Administered 2013-01-26 – 2013-01-29 (×7): 237 mL via ORAL

## 2013-01-24 MED ORDER — HYDRALAZINE HCL 20 MG/ML IJ SOLN: 10.0000 mg | Freq: Four times a day (QID) | INTRAMUSCULAR | Status: DC | PRN

## 2013-01-24 MED ORDER — SODIUM CHLORIDE 0.9 % IV SOLN: 1000.0000 mL | INTRAVENOUS | Status: DC

## 2013-01-24 MED ORDER — METOPROLOL SUCCINATE ER 25 MG PO TB24: 25.0000 mg | ORAL_TABLET | Freq: Every day | ORAL | Status: DC

## 2013-01-24 NOTE — H&P (Signed)
[facility] History and Physical  EMMY KENG ZOX:[id number] DOB: 15-Aug-1919 DOA: 01/24/2013  Referring physician: er PCP: No primary provider on file.  Specialists: ortho  Chief Complaint: fall  HPI: Jacqueline Mccann is a 79 y.o. female  Who is from an ALF.  She fell this AM in her room.  She was found by EMS to have shortening and external rotation of her right lower ext.  No headache, no dizziness, no weakness, no change in vision.   She normally walks with rolling walker.    In the ER, she was found to have a right intertrochanteric fracture and orthopedics was consulted.  Her kidney function was slightly elevated. Baseline not certain.  No cardiac history per family- await EKG      Review of Systems: all systems reviewed, negative unless stated above   Past Medical History  Diagnosis Date  . Hypertension   . Dementia   . Stroke   . Renal disorder   . Renal failure   . Hyperlipidemia    History reviewed. No pertinent past surgical history. Social History:  has no tobacco, alcohol, and drug history on file. Lives at an ALF  No Known Allergies  History reviewed. No pertinent family history.   Prior to Admission medications   Medication Sig Start Date End Date Taking? Authorizing Provider  amLODipine (NORVASC) 5 MG tablet Take 5 mg by mouth daily.   Yes Historical Provider, MD  aspirin 81 MG tablet Take 81 mg by mouth daily.   Yes Historical Provider, MD  clopidogrel (PLAVIX) 75 MG tablet Take 75 mg by mouth daily.   Yes Historical Provider, MD  dorzolamide (TRUSOPT) 2 % ophthalmic solution Place 1 drop into both eyes 2 (two) times daily.   Yes Historical Provider, MD  latanoprost (XALATAN) 0.005 % ophthalmic solution 1 drop at bedtime.   Yes Historical Provider, MD  losartan (COZAAR) 100 MG tablet Take 100 mg by mouth daily.   Yes Historical Provider, MD  meclizine (ANTIVERT) 25 MG tablet Take 25 mg by mouth 3 (three) times daily as needed for  dizziness.   Yes Historical Provider, MD  metoprolol succinate (TOPROL-XL) 25 MG 24 hr tablet Take 25 mg by mouth daily.   Yes Historical Provider, MD  polyethylene glycol (MIRALAX / GLYCOLAX) packet Take 17 g by mouth daily.   Yes Historical Provider, MD  simvastatin (ZOCOR) 20 MG tablet Take 20 mg by mouth every evening.   Yes Historical Provider, MD  timolol (TIMOPTIC) 0.25 % ophthalmic solution Place 1 drop into both eyes 2 (two) times daily.   Yes Historical Provider, MD   Physical Exam: Filed Vitals:   01/24/13 1130  BP: 125/56  Pulse: 57  Temp:   Resp: 17     General:  Resting- hard of hearing  Eyes: wearing glasses  ENT: wnl  Neck: supple  Cardiovascular: rrr  Respiratory: clear, no wheezing  Abdomen: +BS, soft, NT  Skin: no lesions/breakdown  Musculoskeletal: right leg rotated externally and shortened  Psychiatric: normla  Neurologic: CN 2-12 intact  Labs on Admission:  Basic Metabolic Panel:  Recent Labs Lab 01/24/13 0950  NA 142  K 4.9  CL 108  CO2 23  GLUCOSE 197*  BUN 57*  CREATININE 1.90*  CALCIUM 9.3   Liver Function Tests: No results found for this basename: AST, ALT, ALKPHOS, BILITOT, PROT, ALBUMIN,  in the last 168 hours No results found for this basename: LIPASE, AMYLASE,  in the last 168 hours No  results found for this basename: AMMONIA,  in the last 168 hours CBC:  Recent Labs Lab 01/24/13 0950  WBC 10.7*  NEUTROABS 9.1*  HGB 10.5*  HCT 30.3*  MCV 88.9  PLT 210   Cardiac Enzymes: No results found for this basename: CKTOTAL, CKMB, CKMBINDEX, TROPONINI,  in the last 168 hours  BNP (last 3 results) No results found for this basename: PROBNP,  in the last 8760 hours CBG: No results found for this basename: GLUCAP,  in the last 168 hours  Radiological Exams on Admission: Dg Chest 1 View  01/24/2013   CLINICAL DATA:  Fall  EXAM: CHEST - 1 VIEW  COMPARISON:  10/02/2010  FINDINGS: Heart size and vascularity are normal. Lungs  are clear without infiltrate effusion or mass. No change from the prior study.  IMPRESSION: No active disease.   Electronically Signed   By: Marlan Palau M.D.   On: 01/24/2013 10:41   Dg Hip Complete Right  01/24/2013   CLINICAL DATA:  Fall. Right hip pain  EXAM: RIGHT HIP - COMPLETE 2+ VIEW  COMPARISON:  None.  FINDINGS: Intertrochanteric fracture right femur with angulation. Right hip joint appears normal. No other fracture.  IMPRESSION: Angulated intertrochanteric fracture on the right.   Electronically Signed   By: Marlan Palau M.D.   On: 01/24/2013 10:42    EKG: Independently reviewed. Not yet done- ordered  Assessment/Plan Active Problems:   Fracture of hip, right, closed   Dementia   HTN (hypertension)   AKI (acute kidney injury)   Hip fracture protocol implemented- ortho to see, pain meds, PT/OT eval for AM, not yet sure when patient will be taken to OR Dementia- mild, at baseline HTN- stable AKI- gentle IVF and monitor, patient seems dry    Code Status: full Family Communication: patient Disposition Plan: admit  Time spent: 75 min  Devora Tortorella [facility] Pager [phone]  If 7PM-7AM, please contact night-coverage [email or web address] Password [email or web address] 01/24/2013, 11:50 AM

## 2013-01-24 NOTE — ED Provider Notes (Addendum)
CSN: [id number]     Arrival date & time 01/24/13  4098 History   First MD Initiated Contact with Patient 01/24/13 [phone]     Chief Complaint  Patient presents with  . Fall  . Leg Pain    HPI Patient presents from the assisted living Center after mechanical fall today where she fell on her right hip and presents with shortening and external rotation of her right lower extremity.  She denies head injury.  No headache or neck pain.  She denies weakness of her upper lower extremities.  No chest pain shortness of breath.  No abdominal pain.  No recent nausea vomiting or diarrhea.  She states her appetite has been normal.  She's been in normal state of health.  She is functional and lives at the assisted living Center and is normally ambulatory and performs all of her own ADLs.  Her pain is mild to moderate in severity at this time and worse with palpation and range of motion of her right hip.    Past Medical History  Diagnosis Date  . Hypertension   . Dementia   . Stroke   . Renal disorder   . Renal failure   . Hyperlipidemia    History reviewed. No pertinent past surgical history. History reviewed. No pertinent family history. History  Substance Use Topics  . Smoking status: Unknown If Ever Smoked  . Smokeless tobacco: Not on file  . Alcohol Use: Not on file   OB History   Grav Para Term Preterm Abortions TAB SAB Ect Mult Living                 Review of Systems  All other systems reviewed and are negative.    Allergies  Review of patient's allergies indicates no known allergies.  Home Medications   Current Outpatient Rx  Name  Route  Sig  Dispense  Refill  . amLODipine (NORVASC) 5 MG tablet   Oral   Take 5 mg by mouth daily.         Marland Kitchen aspirin 81 MG tablet   Oral   Take 81 mg by mouth daily.         . clopidogrel (PLAVIX) 75 MG tablet   Oral   Take 75 mg by mouth daily.         . dorzolamide (TRUSOPT) 2 % ophthalmic solution   Both Eyes   Place 1 drop  into both eyes 2 (two) times daily.         Marland Kitchen latanoprost (XALATAN) 0.005 % ophthalmic solution      1 drop at bedtime.         Marland Kitchen losartan (COZAAR) 100 MG tablet   Oral   Take 100 mg by mouth daily.         . meclizine (ANTIVERT) 25 MG tablet   Oral   Take 25 mg by mouth 3 (three) times daily as needed for dizziness.         . metoprolol succinate (TOPROL-XL) 25 MG 24 hr tablet   Oral   Take 25 mg by mouth daily.         . polyethylene glycol (MIRALAX / GLYCOLAX) packet   Oral   Take 17 g by mouth daily.         . simvastatin (ZOCOR) 20 MG tablet   Oral   Take 20 mg by mouth every evening.         . timolol (TIMOPTIC) 0.25 % ophthalmic  solution   Both Eyes   Place 1 drop into both eyes 2 (two) times daily.          BP 125/56  Pulse 57  Temp(Src) 97.5 F (36.4 C) (Oral)  Resp 17  SpO2 99% Physical Exam  Nursing note and vitals reviewed. Constitutional: She is oriented to person, place, and time. She appears well-developed and well-nourished. No distress.  HENT:  Head: Normocephalic and atraumatic.  Eyes: EOM are normal.  Neck: Normal range of motion.  Cardiovascular: Normal rate, regular rhythm and normal heart sounds.   Pulmonary/Chest: Effort normal and breath sounds normal.  Abdominal: Soft. She exhibits no distension. There is no tenderness.  Musculoskeletal: Normal range of motion.  Shortening and external rotation of her right lower extremity.  Pain with range of motion of her right hip.  Normal pulses in right foot.  Compartments are soft.  Neurological: She is alert and oriented to person, place, and time.  Skin: Skin is warm and dry.  Psychiatric: She has a normal mood and affect. Judgment normal.    ED Course  Procedures (including critical care time) Labs Review Labs Reviewed  BASIC METABOLIC PANEL - Abnormal; Notable for the following:    Glucose, Bld 197 (*)    BUN 57 (*)    Creatinine, Ser 1.90 (*)    GFR calc non Af Amer 22  (*)    GFR calc Af Amer 25 (*)    All other components within normal limits  CBC WITH DIFFERENTIAL - Abnormal; Notable for the following:    WBC 10.7 (*)    RBC 3.41 (*)    Hemoglobin 10.5 (*)    HCT 30.3 (*)    Neutrophils Relative % 85 (*)    Neutro Abs 9.1 (*)    Lymphocytes Relative 8 (*)    All other components within normal limits  PROTIME-INR  URINALYSIS, ROUTINE W REFLEX MICROSCOPIC  TYPE AND SCREEN  ABO/RH   Imaging Review Dg Chest 1 View  01/24/2013   CLINICAL DATA:  Fall  EXAM: CHEST - 1 VIEW  COMPARISON:  10/02/2010  FINDINGS: Heart size and vascularity are normal. Lungs are clear without infiltrate effusion or mass. No change from the prior study.  IMPRESSION: No active disease.   Electronically Signed   By: Marlan Palau M.D.   On: 01/24/2013 10:41   Dg Hip Complete Right  01/24/2013   CLINICAL DATA:  Fall. Right hip pain  EXAM: RIGHT HIP - COMPLETE 2+ VIEW  COMPARISON:  None.  FINDINGS: Intertrochanteric fracture right femur with angulation. Right hip joint appears normal. No other fracture.  IMPRESSION: Angulated intertrochanteric fracture on the right.   Electronically Signed   By: Marlan Palau M.D.   On: 01/24/2013 10:42  I personally reviewed the imaging tests through PACS system I reviewed available ER/hospitalization records through the EMR   EKG Interpretation   None       MDM   1. Closed right hip fracture, initial encounter    Right intertrochanteric fracture.  Orthopedic consultation.  Patient will be admitted to the hospital service.  The fall sounds mechanical in nature.  She does have some mild elevation of BUN and creatinine this is likely prerenal.  Patient will be hydrated here in the emergency department.  N.p.o.    Lyanne Co, MD 01/24/13 1146  ECG interpretation   Date: 01/24/2013  Rate: 61  Rhythm: normal sinus rhythm  QRS Axis: normal  Intervals: normal  ST/T Wave  abnormalities: normal  Conduction Disutrbances: none   Narrative Interpretation:   Old EKG Reviewed: No significant changes noted     Lyanne Co, MD 01/24/13 1705

## 2013-01-24 NOTE — ED Notes (Signed)
Patient transported to X-ray 

## 2013-01-24 NOTE — Progress Notes (Signed)
INITIAL NUTRITION ASSESSMENT  DOCUMENTATION CODES Per approved criteria  -Not Applicable   INTERVENTION: Ensure Complete po BID, each supplement provides 350 kcal and 13 grams of protein.  NUTRITION DIAGNOSIS: Increased nutrient needs related to hip fracture as evidenced by estimated needs.   Goal: Pt to meet >/= 90% of their estimated nutrition needs   Monitor:  Diet advancement, PO intake, weight   Reason for Assessment: Hip Fracture Protocol Consult  78 y.o. female  Admitting Dx: <principal problem not specified>  ASSESSMENT: Pt admitted from her ALF after a fall with a hip fracture. Ortho consult pending. Per RN pt will likely have surgery tomorrow.  Per pt she has had no recent weight loss and has a good appetite at home. Pt has been living at an ALF that provides meals for her. Pt states that she eats in the cafeteria for lunch and dinner. Pt loves ensure and does drink them.   Nutrition Focused Physical Exam:  Subcutaneous Fat:  Orbital Region: WNL Upper Arm Region: WNL Thoracic and Lumbar Region: WNL  Muscle:  Temple Region: WNL Clavicle Bone Region: WNL Clavicle and Acromion Bone Region: WNL Scapular Bone Region: WNL Dorsal Hand: severe wasting Patellar Region: WNL Anterior Thigh Region: WNL Posterior Calf Region: WNL  Edema: not present   Height: Ht Readings from Last 1 Encounters:  01/24/13 5\' 2"  (1.575 m)    Weight: Wt Readings from Last 1 Encounters:  01/24/13 112 lb (50.803 kg)    Ideal Body Weight: 50 kg   % Ideal Body Weight: 100%  Wt Readings from Last 10 Encounters:  01/24/13 112 lb (50.803 kg)    Usual Body Weight: 110-112 lb   % Usual Body Weight: 100%  BMI:  Body mass index is 20.48 kg/(m^2).  Estimated Nutritional Needs: Kcal: 1250-1400 Protein: 60-70 grams Fluid: > 1.5 L/day  Skin: no issues noted  Diet Order: General  EDUCATION NEEDS: -No education needs identified at this time   Intake/Output Summary (Last  24 hours) at 01/24/13 1603 Last data filed at 01/24/13 1253  Gross per 24 hour  Intake      0 ml  Output    445 ml  Net   -445 ml    Last BM: PTA   Labs:   Recent Labs Lab 01/24/13 0950  NA 142  K 4.9  CL 108  CO2 23  BUN 57*  CREATININE 1.90*  CALCIUM 9.3  GLUCOSE 197*    CBG (last 3)  No results found for this basename: GLUCAP,  in the last 72 hours  Scheduled Meds: . amLODipine  5 mg Oral Daily  . dorzolamide  1 drop Both Eyes BID  . enoxaparin (LOVENOX) injection  30 mg Subcutaneous Q24H  . latanoprost  1 drop Both Eyes QHS  . metoprolol succinate  25 mg Oral Daily  . [START ON 01/25/2013] polyethylene glycol  17 g Oral Daily  . simvastatin  20 mg Oral QPM  . timolol  1 drop Both Eyes BID    Continuous Infusions: . sodium chloride      Past Medical History  Diagnosis Date  . Hypertension   . Dementia   . Stroke   . Renal disorder   . Renal failure   . Hyperlipidemia     History reviewed. No pertinent past surgical history.  Kendell Bane RD, LDN, CNSC [phone] Pager [phone] After Hours Pager

## 2013-01-24 NOTE — ED Notes (Signed)
Pt from [facility] via [facility].  Pt was found on the group around 815am.  Pt does not remember falling and has a hx of dementia and stroke.  Pt c/o of right leg pain.  Shortening and rotaion of right leg noted by EMS.  Pt on plavix. Pt in NAD.

## 2013-01-24 NOTE — ED Notes (Signed)
Pt returned from xray

## 2013-01-24 NOTE — Consult Note (Signed)
ORTHOPAEDIC CONSULTATION  REQUESTING PHYSICIAN: Joseph Art, DO  Chief Complaint: Right Hip intertrochanteric fracture, unstable  HPI: Jacqueline Mccann is a 79 y.o. female who complains of  A fall from standing.  Past Medical History  Diagnosis Date  . Hypertension   . Dementia   . Stroke   . Renal disorder   . Renal failure   . Hyperlipidemia    History reviewed. No pertinent past surgical history. History   Social History  . Marital Status: Widowed    Spouse Name: N/A    Number of Children: N/A  . Years of Education: N/A   Social History Main Topics  . Smoking status: Unknown If Ever Smoked  . Smokeless tobacco: None  . Alcohol Use: None  . Drug Use: None  . Sexual Activity: None   Other Topics Concern  . None   Social History Narrative  . None   History reviewed. No pertinent family history. No Known Allergies Prior to Admission medications   Medication Sig Start Date End Date Taking? Authorizing Provider  amLODipine (NORVASC) 5 MG tablet Take 5 mg by mouth daily.   Yes Historical Provider, MD  aspirin 81 MG tablet Take 81 mg by mouth daily.   Yes Historical Provider, MD  clopidogrel (PLAVIX) 75 MG tablet Take 75 mg by mouth daily.   Yes Historical Provider, MD  dorzolamide (TRUSOPT) 2 % ophthalmic solution Place 1 drop into both eyes 2 (two) times daily.   Yes Historical Provider, MD  latanoprost (XALATAN) 0.005 % ophthalmic solution 1 drop at bedtime.   Yes Historical Provider, MD  losartan (COZAAR) 100 MG tablet Take 100 mg by mouth daily.   Yes Historical Provider, MD  meclizine (ANTIVERT) 25 MG tablet Take 25 mg by mouth 3 (three) times daily as needed for dizziness.   Yes Historical Provider, MD  metoprolol succinate (TOPROL-XL) 25 MG 24 hr tablet Take 25 mg by mouth daily.   Yes Historical Provider, MD  polyethylene glycol (MIRALAX / GLYCOLAX) packet Take 17 g by mouth daily.   Yes Historical Provider, MD  simvastatin (ZOCOR) 20 MG tablet  Take 20 mg by mouth every evening.   Yes Historical Provider, MD  timolol (TIMOPTIC) 0.25 % ophthalmic solution Place 1 drop into both eyes 2 (two) times daily.   Yes Historical Provider, MD   Dg Chest 1 View  01/24/2013   CLINICAL DATA:  Fall  EXAM: CHEST - 1 VIEW  COMPARISON:  10/02/2010  FINDINGS: Heart size and vascularity are normal. Lungs are clear without infiltrate effusion or mass. No change from the prior study.  IMPRESSION: No active disease.   Electronically Signed   By: Marlan Palau M.D.   On: 01/24/2013 10:41   Dg Hip Complete Right  01/24/2013   CLINICAL DATA:  Fall. Right hip pain  EXAM: RIGHT HIP - COMPLETE 2+ VIEW  COMPARISON:  None.  FINDINGS: Intertrochanteric fracture right femur with angulation. Right hip joint appears normal. No other fracture.  IMPRESSION: Angulated intertrochanteric fracture on the right.   Electronically Signed   By: Marlan Palau M.D.   On: 01/24/2013 10:42    Positive ROS: All other systems have been reviewed and were otherwise negative with the exception of those mentioned in the HPI and as above.  Labs cbc  Recent Labs  01/24/13 0950  WBC 10.7*  HGB 10.5*  HCT 30.3*  PLT 210    Labs inflam No results found for this basename: ESR,  CRP,  in the last 72 hours  Labs coag  Recent Labs  01/24/13 0950  INR 1.17     Recent Labs  01/24/13 0950  NA 142  K 4.9  CL 108  CO2 23  GLUCOSE 197*  BUN 57*  CREATININE 1.90*  CALCIUM 9.3    Physical Exam: Filed Vitals:   01/24/13 1346  BP: 167/76  Pulse: 71  Temp: 98.9 F (37.2 C)  Resp:    General: Alert, no acute distress Cardiovascular: No pedal edema Respiratory: No cyanosis, no use of accessory musculature GI: No organomegaly, abdomen is soft and non-tender Skin: No lesions in the area of chief complaint Neurologic: Sensation intact distally Psychiatric: patient is somnolent Lymphatic: No axillary or cervical lymphadenopathy  MUSCULOSKELETAL:  RLE: pain with ROM,  SKin benign, Distally NVI Other extremities are atraumatic with painless ROM and NVI.  Assessment: Right Hip Fracture  Plan: Plan for IMNail of Right hip tomorrow if cleared by medicine Weight Bearing Status: Bedrest for now, likely WBAT post op. PT/OT VTE px: SCD's and Hold in AM then restart ASA and plavix post op.    Margarita Rana, D, MD Cell [phone]   01/24/2013 2:15 PM

## 2013-01-24 NOTE — Progress Notes (Signed)
Utilization Review Completed.Jacqueline Mccann T10/15/2014  

## 2013-01-25 ENCOUNTER — Encounter (HOSPITAL_COMMUNITY): Payer: PRIVATE HEALTH INSURANCE | Admitting: Anesthesiology

## 2013-01-25 ENCOUNTER — Inpatient Hospital Stay (HOSPITAL_COMMUNITY): Payer: PRIVATE HEALTH INSURANCE | Admitting: Anesthesiology

## 2013-01-25 ENCOUNTER — Inpatient Hospital Stay (HOSPITAL_COMMUNITY): Payer: PRIVATE HEALTH INSURANCE

## 2013-01-25 ENCOUNTER — Encounter (HOSPITAL_COMMUNITY)
Admission: EM | Disposition: A | Discharge status: Skilled Nursing Facility | Payer: Self-pay | Source: Home / Self Care | Attending: Internal Medicine

## 2013-01-25 DIAGNOSIS — F0390 Unspecified dementia without behavioral disturbance: Secondary | ICD-10-CM

## 2013-01-25 HISTORY — PX: FEMUR IM NAIL: SHX1597

## 2013-01-25 LAB — BASIC METABOLIC PANEL
BUN: 60 mg/dL — ABNORMAL HIGH (ref 6–23)
Calcium: 7.9 mg/dL — ABNORMAL LOW (ref 8.4–10.5)
Calcium: 8.3 mg/dL — ABNORMAL LOW (ref 8.4–10.5)
Creatinine, Ser: 1.87 mg/dL — ABNORMAL HIGH (ref 0.50–1.10)
GFR calc Af Amer: 25 mL/min — ABNORMAL LOW (ref >90)
GFR calc Af Amer: 26 mL/min — ABNORMAL LOW (ref >90)
GFR calc non Af Amer: 22 mL/min — ABNORMAL LOW (ref >90)
GFR calc non Af Amer: 22 mL/min — ABNORMAL LOW (ref >90)
Potassium: 5.1 mEq/L (ref 3.5–5.1)
Sodium: 142 mEq/L (ref 135–145)
Sodium: 143 mEq/L (ref 135–145)

## 2013-01-25 LAB — CBC
Hemoglobin: 7.9 g/dL — ABNORMAL LOW (ref 12.0–15.0)
MCH: 31.6 pg (ref 26.0–34.0)
MCHC: 35.4 g/dL (ref 30.0–36.0)
Platelets: 156 10*3/uL (ref 150–400)

## 2013-01-25 LAB — SURGICAL PCR SCREEN
MRSA, PCR: NEGATIVE
Staphylococcus aureus: POSITIVE — AB

## 2013-01-25 SURGERY — INSERTION, INTRAMEDULLARY ROD, FEMUR
Anesthesia: General | Site: Hip | Laterality: Right | Wound class: Clean

## 2013-01-25 MED ORDER — LORAZEPAM 2 MG/ML IJ SOLN
0.5000 mg | Freq: Once | INTRAMUSCULAR | Status: AC
  Administered 2013-01-25: 0.5 mg via INTRAMUSCULAR
  Filled 2013-01-25: qty 1

## 2013-01-25 MED ORDER — CEFAZOLIN SODIUM-DEXTROSE 2-3 GM-% IV SOLR
INTRAVENOUS | Status: AC
  Filled 2013-01-25: qty 50

## 2013-01-25 MED ORDER — ONDANSETRON HCL 4 MG/2ML IJ SOLN
INTRAMUSCULAR | Status: DC | PRN
  Administered 2013-01-25: 4 mg via INTRAMUSCULAR

## 2013-01-25 MED ORDER — ASPIRIN EC 325 MG PO TBEC: 325.0000 mg | DELAYED_RELEASE_TABLET | Freq: Every day | ORAL | Status: DC

## 2013-01-25 MED ORDER — ROCURONIUM BROMIDE 100 MG/10ML IV SOLN
INTRAVENOUS | Status: DC | PRN
  Administered 2013-01-25: 25 mg via INTRAVENOUS

## 2013-01-25 MED ORDER — DEXTROSE 5 % IV SOLN
10.0000 mg | INTRAVENOUS | Status: DC | PRN
  Administered 2013-01-25: 40 ug/min via INTRAVENOUS

## 2013-01-25 MED ORDER — OXYCODONE HCL 5 MG PO TABS: 5.0000 mg | ORAL_TABLET | Freq: Once | ORAL | Status: DC | PRN

## 2013-01-25 MED ORDER — 0.9 % SODIUM CHLORIDE (POUR BTL) OPTIME
TOPICAL | Status: DC | PRN
  Administered 2013-01-25: 1000 mL

## 2013-01-25 MED ORDER — LIDOCAINE HCL (CARDIAC) 20 MG/ML IV SOLN
INTRAVENOUS | Status: DC | PRN
  Administered 2013-01-25: 60 mg via INTRAVENOUS

## 2013-01-25 MED ORDER — MENTHOL 3 MG MT LOZG
1.0000 | LOZENGE | OROMUCOSAL | Status: DC | PRN
  Filled 2013-01-25 (×2): qty 9

## 2013-01-25 MED ORDER — SODIUM POLYSTYRENE SULFONATE 15 GM/60ML PO SUSP
15.0000 g | Freq: Once | ORAL | Status: DC
  Filled 2013-01-25: qty 60

## 2013-01-25 MED ORDER — ACETAMINOPHEN 650 MG RE SUPP
650.0000 mg | Freq: Four times a day (QID) | RECTAL | Status: DC | PRN
  Filled 2013-01-25: qty 1

## 2013-01-25 MED ORDER — FENTANYL CITRATE 0.05 MG/ML IJ SOLN
INTRAMUSCULAR | Status: DC | PRN
  Administered 2013-01-25: 50 ug via INTRAVENOUS

## 2013-01-25 MED ORDER — OXYCODONE HCL 5 MG/5ML PO SOLN: 5.0000 mg | Freq: Once | ORAL | Status: DC | PRN

## 2013-01-25 MED ORDER — GLYCOPYRROLATE 0.2 MG/ML IJ SOLN
INTRAMUSCULAR | Status: DC | PRN
  Administered 2013-01-25: 0.4 mg via INTRAVENOUS

## 2013-01-25 MED ORDER — MIDAZOLAM HCL 2 MG/2ML IJ SOLN: 1.0000 mg | INTRAMUSCULAR | Status: DC | PRN

## 2013-01-25 MED ORDER — ASPIRIN EC 325 MG PO TBEC
325.0000 mg | DELAYED_RELEASE_TABLET | Freq: Every day | ORAL | Status: DC
  Administered 2013-01-26 – 2013-01-29 (×4): 325 mg via ORAL
  Filled 2013-01-25 (×5): qty 1

## 2013-01-25 MED ORDER — PROPOFOL 10 MG/ML IV BOLUS
INTRAVENOUS | Status: DC | PRN
  Administered 2013-01-25: 50 mg via INTRAVENOUS
  Administered 2013-01-25: 70 mg via INTRAVENOUS

## 2013-01-25 MED ORDER — SODIUM CHLORIDE 0.9 % IV SOLN
INTRAVENOUS | Status: DC | PRN
  Administered 2013-01-25: 15:00:00 via INTRAVENOUS

## 2013-01-25 MED ORDER — CEFAZOLIN SODIUM-DEXTROSE 2-3 GM-% IV SOLR
2.0000 g | Freq: Four times a day (QID) | INTRAVENOUS | Status: AC
  Administered 2013-01-25 – 2013-01-26 (×2): 2 g via INTRAVENOUS
  Filled 2013-01-25 (×3): qty 50

## 2013-01-25 MED ORDER — LACTATED RINGERS IV SOLN
INTRAVENOUS | Status: DC | PRN
  Administered 2013-01-25: 14:00:00 via INTRAVENOUS

## 2013-01-25 MED ORDER — FENTANYL CITRATE 0.05 MG/ML IJ SOLN: 50.0000 ug | Freq: Once | INTRAMUSCULAR | Status: DC

## 2013-01-25 MED ORDER — NEOSTIGMINE METHYLSULFATE 1 MG/ML IJ SOLN
INTRAMUSCULAR | Status: DC | PRN
  Administered 2013-01-25: 3 mg via INTRAVENOUS

## 2013-01-25 MED ORDER — ACETAMINOPHEN 325 MG PO TABS
650.0000 mg | ORAL_TABLET | Freq: Four times a day (QID) | ORAL | Status: DC | PRN
  Administered 2013-01-26 – 2013-01-29 (×4): 650 mg via ORAL
  Filled 2013-01-25 (×4): qty 2

## 2013-01-25 MED ORDER — HYDROCODONE-ACETAMINOPHEN 5-325 MG PO TABS: 2.0000 | ORAL_TABLET | ORAL | Status: DC | PRN

## 2013-01-25 MED ORDER — FENTANYL CITRATE 0.05 MG/ML IJ SOLN
INTRAMUSCULAR | Status: AC
  Filled 2013-01-25: qty 2

## 2013-01-25 MED ORDER — CEFAZOLIN SODIUM-DEXTROSE 2-3 GM-% IV SOLR
INTRAVENOUS | Status: DC | PRN
  Administered 2013-01-25: 2 g via INTRAVENOUS

## 2013-01-25 MED ORDER — DEXTROSE-NACL 5-0.45 % IV SOLN: INTRAVENOUS | Status: AC

## 2013-01-25 MED ORDER — CLOPIDOGREL BISULFATE 75 MG PO TABS
75.0000 mg | ORAL_TABLET | Freq: Every day | ORAL | Status: DC
  Administered 2013-01-26 – 2013-01-29 (×4): 75 mg via ORAL
  Filled 2013-01-25 (×6): qty 1

## 2013-01-25 MED ORDER — FENTANYL CITRATE 0.05 MG/ML IJ SOLN
25.0000 ug | INTRAMUSCULAR | Status: DC | PRN
  Administered 2013-01-25 (×4): 25 ug via INTRAVENOUS

## 2013-01-25 MED ORDER — LACTATED RINGERS IV SOLN
INTRAVENOUS | Status: DC
  Administered 2013-01-25 – 2013-01-29 (×3): via INTRAVENOUS

## 2013-01-25 MED ORDER — PHENOL 1.4 % MT LIQD
1.0000 | OROMUCOSAL | Status: DC | PRN
  Administered 2013-01-29: 1 via OROMUCOSAL
  Filled 2013-01-25: qty 177

## 2013-01-25 SURGICAL SUPPLY — 39 items
BIT DRILL AO GAMMA 4.2X180 (BIT) ×1 IMPLANT
CLOTH BEACON ORANGE TIMEOUT ST (SAFETY) ×2 IMPLANT
COVER SURGICAL LIGHT HANDLE (MISCELLANEOUS) ×2 IMPLANT
DRAPE STERI IOBAN 125X83 (DRAPES) ×2 IMPLANT
DRSG EMULSION OIL 3X3 NADH (GAUZE/BANDAGES/DRESSINGS) ×2 IMPLANT
DRSG TEGADERM 4X4.75 (GAUZE/BANDAGES/DRESSINGS) ×4 IMPLANT
DURAPREP 26ML APPLICATOR (WOUND CARE) ×2 IMPLANT
ELECT REM PT RETURN 9FT ADLT (ELECTROSURGICAL) ×2
ELECTRODE REM PT RTRN 9FT ADLT (ELECTROSURGICAL) ×1 IMPLANT
GLOVE BIO SURGEON STRL SZ7.5 (GLOVE) ×2 IMPLANT
GLOVE BIOGEL M 7.0 STRL (GLOVE) ×1 IMPLANT
GLOVE BIOGEL PI IND STRL 7.5 (GLOVE) IMPLANT
GLOVE BIOGEL PI IND STRL 8 (GLOVE) ×1 IMPLANT
GLOVE BIOGEL PI INDICATOR 7.5 (GLOVE) ×1
GLOVE BIOGEL PI INDICATOR 8 (GLOVE) ×1
GOWN PREVENTION PLUS LG XLONG (DISPOSABLE) ×1 IMPLANT
GOWN STRL NON-REIN LRG LVL3 (GOWN DISPOSABLE) ×2 IMPLANT
GUIDEROD T2 3X1000 (ROD) ×1 IMPLANT
K-WIRE  3.2X450M STR (WIRE) ×2
K-WIRE 3.2X450M STR (WIRE) ×2
KIT BASIN OR (CUSTOM PROCEDURE TRAY) ×2 IMPLANT
KIT NAIL LONG 10X380MMX125 (Nail) ×1 IMPLANT
KIT ROOM TURNOVER OR (KITS) ×2 IMPLANT
KWIRE 3.2X450M STR (WIRE) IMPLANT
MANIFOLD NEPTUNE II (INSTRUMENTS) ×2 IMPLANT
NS IRRIG 1000ML POUR BTL (IV SOLUTION) ×2 IMPLANT
PACK GENERAL/GYN (CUSTOM PROCEDURE TRAY) ×2 IMPLANT
PAD ARMBOARD 7.5X6 YLW CONV (MISCELLANEOUS) ×4 IMPLANT
SCREW LAG GAMMA 3 95MM (Screw) ×1 IMPLANT
SCREW LOCKING T2 F/T  5X42.5MM (Screw) ×1 IMPLANT
SCREW LOCKING T2 F/T 5X42.5MM (Screw) IMPLANT
SPONGE GAUZE 4X4 12PLY (GAUZE/BANDAGES/DRESSINGS) ×2 IMPLANT
STAPLER VISISTAT 35W (STAPLE) ×2 IMPLANT
SUT MON AB 2-0 CT1 36 (SUTURE) ×2 IMPLANT
SUT VIC AB 0 CT1 27 (SUTURE) ×2
SUT VIC AB 0 CT1 27XBRD ANBCTR (SUTURE) ×1 IMPLANT
TOWEL OR 17X24 6PK STRL BLUE (TOWEL DISPOSABLE) ×2 IMPLANT
TOWEL OR 17X26 10 PK STRL BLUE (TOWEL DISPOSABLE) ×2 IMPLANT
WATER STERILE IRR 1000ML POUR (IV SOLUTION) ×2 IMPLANT

## 2013-01-25 NOTE — Preoperative (Signed)
Beta Blockers   Reason not to administer Beta Blockers:Not Applicable 

## 2013-01-25 NOTE — H&P (View-Only) (Signed)
   ORTHOPAEDIC CONSULTATION  REQUESTING PHYSICIAN: Jessica U Vann, DO  Chief Complaint: Right Hip intertrochanteric fracture, unstable  HPI: Jacqueline Mccann is a 79 y.o. female who complains of  A fall from standing.  Past Medical History  Diagnosis Date  . Hypertension   . Dementia   . Stroke   . Renal disorder   . Renal failure   . Hyperlipidemia    History reviewed. No pertinent past surgical history. History   Social History  . Marital Status: Widowed    Spouse Name: N/A    Number of Children: N/A  . Years of Education: N/A   Social History Main Topics  . Smoking status: Unknown If Ever Smoked  . Smokeless tobacco: None  . Alcohol Use: None  . Drug Use: None  . Sexual Activity: None   Other Topics Concern  . None   Social History Narrative  . None   History reviewed. No pertinent family history. No Known Allergies Prior to Admission medications   Medication Sig Start Date End Date Taking? Authorizing Provider  amLODipine (NORVASC) 5 MG tablet Take 5 mg by mouth daily.   Yes Historical Provider, MD  aspirin 81 MG tablet Take 81 mg by mouth daily.   Yes Historical Provider, MD  clopidogrel (PLAVIX) 75 MG tablet Take 75 mg by mouth daily.   Yes Historical Provider, MD  dorzolamide (TRUSOPT) 2 % ophthalmic solution Place 1 drop into both eyes 2 (two) times daily.   Yes Historical Provider, MD  latanoprost (XALATAN) 0.005 % ophthalmic solution 1 drop at bedtime.   Yes Historical Provider, MD  losartan (COZAAR) 100 MG tablet Take 100 mg by mouth daily.   Yes Historical Provider, MD  meclizine (ANTIVERT) 25 MG tablet Take 25 mg by mouth 3 (three) times daily as needed for dizziness.   Yes Historical Provider, MD  metoprolol succinate (TOPROL-XL) 25 MG 24 hr tablet Take 25 mg by mouth daily.   Yes Historical Provider, MD  polyethylene glycol (MIRALAX / GLYCOLAX) packet Take 17 g by mouth daily.   Yes Historical Provider, MD  simvastatin (ZOCOR) 20 MG tablet  Take 20 mg by mouth every evening.   Yes Historical Provider, MD  timolol (TIMOPTIC) 0.25 % ophthalmic solution Place 1 drop into both eyes 2 (two) times daily.   Yes Historical Provider, MD   Dg Chest 1 View  01/24/2013   CLINICAL DATA:  Fall  EXAM: CHEST - 1 VIEW  COMPARISON:  10/02/2010  FINDINGS: Heart size and vascularity are normal. Lungs are clear without infiltrate effusion or mass. No change from the prior study.  IMPRESSION: No active disease.   Electronically Signed   By: Charles  Clark M.D.   On: 01/24/2013 10:41   Dg Hip Complete Right  01/24/2013   CLINICAL DATA:  Fall. Right hip pain  EXAM: RIGHT HIP - COMPLETE 2+ VIEW  COMPARISON:  None.  FINDINGS: Intertrochanteric fracture right femur with angulation. Right hip joint appears normal. No other fracture.  IMPRESSION: Angulated intertrochanteric fracture on the right.   Electronically Signed   By: Charles  Clark M.D.   On: 01/24/2013 10:42    Positive ROS: All other systems have been reviewed and were otherwise negative with the exception of those mentioned in the HPI and as above.  Labs cbc  Recent Labs  01/24/13 0950  WBC 10.7*  HGB 10.5*  HCT 30.3*  PLT 210    Labs inflam No results found for this basename: ESR,   CRP,  in the last 72 hours  Labs coag  Recent Labs  01/24/13 0950  INR 1.17     Recent Labs  01/24/13 0950  NA 142  K 4.9  CL 108  CO2 23  GLUCOSE 197*  BUN 57*  CREATININE 1.90*  CALCIUM 9.3    Physical Exam: Filed Vitals:   01/24/13 1346  BP: 167/76  Pulse: 71  Temp: 98.9 F (37.2 C)  Resp:    General: Alert, no acute distress Cardiovascular: No pedal edema Respiratory: No cyanosis, no use of accessory musculature GI: No organomegaly, abdomen is soft and non-tender Skin: No lesions in the area of chief complaint Neurologic: Sensation intact distally Psychiatric: patient is somnolent Lymphatic: No axillary or cervical lymphadenopathy  MUSCULOSKELETAL:  RLE: pain with ROM,  SKin benign, Distally NVI Other extremities are atraumatic with painless ROM and NVI.  Assessment: Right Hip Fracture  Plan: Plan for IMNail of Right hip tomorrow if cleared by medicine Weight Bearing Status: Bedrest for now, likely WBAT post op. PT/OT VTE px: SCD's and Hold in AM then restart ASA and plavix post op.    MURPHY, TIMOTHY, D, MD Cell [phone]   01/24/2013 2:15 PM     

## 2013-01-25 NOTE — Interval H&P Note (Signed)
History and Physical Interval Note:  01/25/2013 7:06 AM  Jacqueline Mccann  has presented today for surgery, with the diagnosis of  right hip fracture  The various methods of treatment have been discussed with the patient and family. After consideration of risks, benefits and other options for treatment, the patient has consented to  Procedure(s): INTRAMEDULLARY (IM) NAIL FEMORAL (Right) as a surgical intervention .  The patient's history has been reviewed, patient examined, no change in status, stable for surgery.  I have reviewed the patient's chart and labs.  Questions were answered to the patient's satisfaction.     Elissia Spiewak, D

## 2013-01-25 NOTE — Progress Notes (Signed)
[facility] PROGRESS NOTE  Jacqueline Mccann Jacqueline Mccann ZOX:[id number] DOB: Apr 24, 1919 DOA: 01/24/2013 PCP: No primary provider on file.  Assessment/Plan: Fracture of hip, right, closed ; ortho planning surgery today. Patient is high risk for procedure due to multiple co morbidities. I dont think she need ECHO prior to procedure. She had a prior ECHO with normal EF at 55 and diastolic dysfunction. Will repeat bmet to follow K level. Unclear renal function baseline , last GFR per records at 47. Cr stable at 1.9. Might need transfusion.  I spoke with daughter she understand risk of procedure, and she wishes to proceed with surgery.   Dementia  HTN (hypertension) ; continue with Norvasc and metoprolol.  AKI (acute kidney injury); repeat b-met to follow K level.    Code Status: Full Code.  Family Communication:  Disposition Plan: to be determine.    Consultants:  Dr [name]  Procedures:  none  Antibiotics:  none  HPI/Subjective: Patient denies chest pain, dyspnea.   Objective: Filed Vitals:   01/25/13 0535  BP: 126/65  Pulse: 70  Temp: 98.6 F (37 C)  Resp: 16    Intake/Output Summary (Last 24 hours) at 01/25/13 0857 Last data filed at 01/25/13 0600  Gross per 24 hour  Intake   1100 ml  Output   1345 ml  Net   -245 ml   Filed Weights   01/24/13 1500  Weight: 50.803 kg (112 lb)    Exam:   General:  No distress.   Cardiovascular: S 1, S 2 RRR  Respiratory: CTA  Abdomen: BS present, soft, NT  Musculoskeletal: no edema.   Data Reviewed: Basic Metabolic Panel:  Recent Labs Lab 01/24/13 0950 01/25/13 0555  NA 142 142  K 4.9 5.1  CL 108 113*  CO2 23 20  GLUCOSE 197* 139*  BUN 57* 60*  CREATININE 1.90* 1.92*  CALCIUM 9.3 7.9*   Liver Function Tests: No results found for this basename: AST, ALT, ALKPHOS, BILITOT, PROT, ALBUMIN,  in the last 168 hours No results found for this basename: LIPASE, AMYLASE,  in the last 168 hours No results found for  this basename: AMMONIA,  in the last 168 hours CBC:  Recent Labs Lab 01/24/13 0950 01/25/13 0555  WBC 10.7* 7.9  NEUTROABS 9.1*  --   HGB 10.5* 7.9*  HCT 30.3* 22.3*  MCV 88.9 89.2  PLT 210 156   Cardiac Enzymes: No results found for this basename: CKTOTAL, CKMB, CKMBINDEX, TROPONINI,  in the last 168 hours BNP (last 3 results) No results found for this basename: PROBNP,  in the last 8760 hours CBG: No results found for this basename: GLUCAP,  in the last 168 hours  No results found for this or any previous visit (from the past 240 hour(s)).   Studies: Dg Chest 1 View  01/24/2013   CLINICAL DATA:  Fall  EXAM: CHEST - 1 VIEW  COMPARISON:  10/02/2010  FINDINGS: Heart size and vascularity are normal. Lungs are clear without infiltrate effusion or mass. No change from the prior study.  IMPRESSION: No active disease.   Electronically Signed   By: Marlan Palau M.D.   On: 01/24/2013 10:41   Dg Hip Complete Right  01/24/2013   CLINICAL DATA:  Fall. Right hip pain  EXAM: RIGHT HIP - COMPLETE 2+ VIEW  COMPARISON:  None.  FINDINGS: Intertrochanteric fracture right femur with angulation. Right hip joint appears normal. No other fracture.  IMPRESSION: Angulated intertrochanteric fracture on the right.   Electronically Signed  By: Marlan Palau M.D.   On: 01/24/2013 10:42    Scheduled Meds: . amLODipine  5 mg Oral Daily  . dorzolamide  1 drop Both Eyes BID  . enoxaparin (LOVENOX) injection  30 mg Subcutaneous Q24H  . feeding supplement (ENSURE COMPLETE)  237 mL Oral BID BM  . latanoprost  1 drop Both Eyes QHS  . metoprolol succinate  25 mg Oral Daily  . polyethylene glycol  17 g Oral Daily  . simvastatin  20 mg Oral QPM  . timolol  1 drop Both Eyes BID   Continuous Infusions: . sodium chloride 1,000 mL (01/24/13 1721)    Active Problems:   Fracture of hip, right, closed   Dementia   HTN (hypertension)   AKI (acute kidney injury)    Time spent: 35 minutes.      Jacqueline Mccann  [facility] Pager [phone]. If 7PM-7AM, please contact night-coverage at [email or web address], password [facility] 01/25/2013, 8:57 AM  LOS: 1 day

## 2013-01-25 NOTE — Transfer of Care (Signed)
Immediate Anesthesia Transfer of Care Note  Patient: Jacqueline Mccann  Procedure(s) Performed: Procedure(s): INTRAMEDULLARY (IM) NAIL FEMORAL (Right)  Patient Location: PACU  Anesthesia Type:General  Level of Consciousness: awake, sedated and patient cooperative  Airway & Oxygen Therapy: Patient Spontanous Breathing and Patient connected to face mask oxygen  Post-op Assessment: Report given to PACU RN, Post -op Vital signs reviewed and stable and Patient moving all extremities  Post vital signs: Reviewed and stable  Complications: No apparent anesthesia complications

## 2013-01-25 NOTE — Op Note (Signed)
DATE OF SURGERY:  01/25/2013  TIME: 3:37 PM  PATIENT NAME:  Jacqueline Mccann  AGE: 79 y.o.  PRE-OPERATIVE DIAGNOSIS:  right hip fracture  POST-OPERATIVE DIAGNOSIS:  SAME  PROCEDURE:  INTRAMEDULLARY (IM) NAIL FEMORAL  SURGEON:  MURPHY, TIMOTHY, D  ASSISTANT:  Skip Mayer PA  OPERATIVE IMPLANTS: Stryker Gamma Nail with distal interlock screw  PREOPERATIVE INDICATIONS:  Jacqueline Mccann is a 79 y.o. year old who fell and suffered a hip fracture. She was brought into the ER and then admitted and optimized and then elected for surgical intervention.    The risks benefits and alternatives were discussed with the patient including but not limited to the risks of nonoperative treatment, versus surgical intervention including infection, bleeding, nerve injury, malunion, nonunion, hardware prominence, hardware failure, need for hardware removal, blood clots, cardiopulmonary complications, morbidity, mortality, among others, and they were willing to proceed.    OPERATIVE PROCEDURE:  The patient was brought to the operating room and placed in the supine position. General anesthesia was administered, with a foley. She was placed on the fracture table.  Closed reduction was performed under C-arm guidance. The length of the femur was also measured using fluoroscopy. Time out was then performed after sterile prep and drape. She received preoperative antibiotics.  Incision was made proximal to the greater trochanter. A guidewire was placed in the appropriate position. Confirmation was made on AP and lateral views. The above-named nail was opened. I opened the proximal femur with a reamer. I then placed the nail by hand easily down. I did not need to ream the femur.  Once the nail was completely seated, I placed a guidepin into the femoral head into the center center position. I measured the length, and then reamed the lateral cortex and up into the head. I then placed the lag screw. Slight  compression was applied. Anatomic fixation achieved. Bone quality was mediocre.  I then secured the proximal interlocking bolt, and took off a half a turn, and then removed the instruments, and took final C-arm pictures AP and lateral the entire length of the leg.  The knee is perfect circles technique to place a distal interlock screw.   Anatomic reconstruction was achieved, and the wounds were irrigated copiously and closed with Vicryl followed by staples and sterile gauze for the skin. The patient was awakened and returned to PACU in stable and satisfactory condition. There no complications and the patient tolerated the procedure well.  She will be weightbearing as tolerated, and will be on aspirin  for a period of four weeks after discharge.   Margarita Rana, M.D.

## 2013-01-25 NOTE — Progress Notes (Signed)
PT Cancellation Note  Patient Details Name: Jacqueline Mccann MRN: [id number] DOB: [birth year]   Cancelled Treatment:     PT order received; pt is pending hip surgery today. PT eval not completed and deferred until after surgery. Please reorder therapy.   Greggory Stallion 01/25/2013, 7:43 AM

## 2013-01-25 NOTE — Anesthesia Preprocedure Evaluation (Signed)
Anesthesia Evaluation  Patient identified by MRN, date of birth, ID band Patient awake    Reviewed: Allergy & Precautions, H&P , NPO status , Patient's Chart, lab work & pertinent test results  Airway Mallampati: I TM Distance: >3 FB Neck ROM: Full    Dental   Pulmonary  breath sounds clear to auscultation        Cardiovascular hypertension, Rhythm:Regular Rate:Normal     Neuro/Psych dementia CVA    GI/Hepatic   Endo/Other    Renal/GU Renal InsufficiencyRenal disease     Musculoskeletal   Abdominal   Peds  Hematology   Anesthesia Other Findings   Reproductive/Obstetrics                           Anesthesia Physical Anesthesia Plan  ASA: IV  Anesthesia Plan: General   Post-op Pain Management:    Induction: Intravenous  Airway Management Planned: Oral ETT  Additional Equipment:   Intra-op Plan:   Post-operative Plan: Extubation in OR  Informed Consent: I have reviewed the patients History and Physical, chart, labs and discussed the procedure including the risks, benefits and alternatives for the proposed anesthesia with the patient or authorized representative who has indicated his/her understanding and acceptance.     Plan Discussed with: CRNA and Surgeon  Anesthesia Plan Comments: (On plavix and asa)        Anesthesia Quick Evaluation

## 2013-01-26 LAB — TYPE AND SCREEN
Antibody Screen: NEGATIVE
Unit division: 0

## 2013-01-26 LAB — CBC
HCT: 33.1 % — ABNORMAL LOW (ref 36.0–46.0)
MCH: 30.8 pg (ref 26.0–34.0)
MCHC: 35.3 g/dL (ref 30.0–36.0)
Platelets: 117 10*3/uL — ABNORMAL LOW (ref 150–400)
RDW: 14.3 % (ref 11.5–15.5)

## 2013-01-26 LAB — BASIC METABOLIC PANEL
BUN: 40 mg/dL — ABNORMAL HIGH (ref 6–23)
CO2: 20 mEq/L (ref 19–32)
Calcium: 8.3 mg/dL — ABNORMAL LOW (ref 8.4–10.5)
Chloride: 112 mEq/L (ref 96–112)
Creatinine, Ser: 1.51 mg/dL — ABNORMAL HIGH (ref 0.50–1.10)
GFR calc Af Amer: 34 mL/min — ABNORMAL LOW (ref >90)
Glucose, Bld: 152 mg/dL — ABNORMAL HIGH (ref 70–99)

## 2013-01-26 MED ORDER — HYDROCODONE-ACETAMINOPHEN 5-325 MG PO TABS: 1.0000 | ORAL_TABLET | Freq: Four times a day (QID) | ORAL | Status: DC | PRN

## 2013-01-26 NOTE — Evaluation (Signed)
Occupational Therapy Evaluation Patient Details Name: Jacqueline Mccann MRN: [id number] DOB: [birth year] Today's Date: 01/26/2013 Time: 5621-3086 OT Time Calculation (min): 23 min  OT Assessment / Plan / Recommendation History of present illness Pt is a 79 yo female who fell at her ALF and sustained at R hip fx and acute renal injury.  Pt underwent hip surgery with IM nail and has h/o HTN and dementia.   Clinical Impression   Pt admitted with the above diagnosis and has the deficits listed below.  Pt would benefit from cont OT to increase I with basic adls back to baseline so she can eventually return to her assisted living center.    OT Assessment  Patient needs continued OT Services    Follow Up Recommendations  SNF    Barriers to Discharge Other (comment) (lives in assisted living per chart.)    Equipment Recommendations  None recommended by OT    Recommendations for Other Services    Frequency  Min 2X/week    Precautions / Restrictions Precautions Precautions: Fall Restrictions Weight Bearing Restrictions: Yes Other Position/Activity Restrictions: WBAT on the RLE   Pertinent Vitals/Pain During treatment pt on RA and maintained O2 sat of 100%.  Pt did cringe with standing for first time on R hip.    ADL  Eating/Feeding: Performed;Moderate assistance Where Assessed - Eating/Feeding: Chair Grooming: Performed;Moderate assistance Where Assessed - Grooming: Supported sitting Upper Body Bathing: Simulated;Moderate assistance Where Assessed - Upper Body Bathing: Supported sitting Lower Body Bathing: Simulated;Maximal assistance Where Assessed - Lower Body Bathing: Supported sit to stand Upper Body Dressing: Simulated;Moderate assistance Where Assessed - Upper Body Dressing: Supported sitting Lower Body Dressing: Simulated;+1 Total assistance Where Assessed - Lower Body Dressing: Supported sit to stand Toilet Transfer: Performed;+2 Total assistance Toilet Transfer:  Patient Percentage: 30% Statistician Method: Ambulance person: Materials engineer and Hygiene: Simulated;+1 Total assistance Where Assessed - Engineer, mining and Hygiene: Standing Equipment Used: Rolling walker;Gait belt Transfers/Ambulation Related to ADLs: Pt unable to walk due to pain in R hip.  Pt's cognitive status so limited that it was unsafe to attempt ambulation.  Pt also limited by pain.  Pt transferred to chair with +2 assist  ADL Comments: Pt unable to participate in many adls due to cognitive status.  Pt very fixated on who "stole" her purse and eye glasses.  Did speak to nursing about locating them to possibly put this pt more at ease.  Chart states she is very I at assisted living.  She may just be confused due to change in environment.    OT Diagnosis: Generalized weakness;Cognitive deficits;Acute pain  OT Problem List: Decreased strength;Decreased activity tolerance;Impaired balance (sitting and/or standing);Decreased cognition;Decreased safety awareness;Decreased knowledge of use of DME or AE;Decreased knowledge of precautions;Pain OT Treatment Interventions: Self-care/ADL training;Therapeutic activities;DME and/or AE instruction   OT Goals(Current goals can be found in the care plan section) Acute Rehab OT Goals Patient Stated Goal: none stated. OT Goal Formulation: Patient unable to participate in goal setting Time For Goal Achievement: 02/09/13 Potential to Achieve Goals: Fair ADL Goals Pt Will Perform Eating: with set-up;sitting Pt Will Perform Grooming: with supervision;sitting Pt Will Perform Upper Body Bathing: with supervision;with set-up;sitting Pt Will Transfer to Toilet: with mod assist;bedside commode;stand pivot transfer Pt Will Perform Toileting - Clothing Manipulation and hygiene: with mod assist;sit to/from stand  Visit Information  Last OT Received On: 01/26/13 Assistance Needed:  +2 PT/OT Co-Evaluation/Treatment: Yes History of Present Illness: Pt  is a 79 yo female who fell at her ALF and sustained at R hip fx and acute renal injury.  Pt underwent hip surgery with IM nail and has h/o HTN and dementia.       Prior Functioning     Home Living Family/patient expects to be discharged to:: Skilled nursing facility Prior Function Level of Independence: Needs assistance Gait / Transfers Assistance Needed: Pt states she walked alone w/o walker in ALF (unsure of accuracy) ADL's / Homemaking Assistance Needed: Chart states pt could do own basic self care at ALF.  Again, unsure of accuracy. Communication Communication: HOH Dominant Hand: Right         Vision/Perception Vision - History Baseline Vision: Wears glasses all the time (unable to fully evaluate) Vision - Assessment Vision Assessment: Vision not tested Additional Comments: unable to accurately evaluate vision.   Cognition  Cognition Arousal/Alertness: Lethargic Behavior During Therapy: Anxious Overall Cognitive Status: No family/caregiver present to determine baseline cognitive functioning Area of Impairment: Orientation;Attention;Memory;Safety/judgement;Awareness;Problem solving Orientation Level: Disoriented to;Place Current Attention Level: Focused Memory: Decreased recall of precautions;Decreased short-term memory Safety/Judgement: Decreased awareness of safety;Decreased awareness of deficits Awareness: Intellectual Problem Solving: Slow processing;Decreased initiation;Requires verbal cues;Requires tactile cues General Comments: Pt with dementia per chart but unsure what he baseline cognitive status is.    Extremity/Trunk Assessment Upper Extremity Assessment Upper Extremity Assessment: Generalized weakness Lower Extremity Assessment Lower Extremity Assessment: Defer to PT evaluation Cervical / Trunk Assessment Cervical / Trunk Assessment: Kyphotic     Mobility Bed Mobility Bed Mobility:  Sitting - Scoot to Edge of Bed;Supine to Sit Supine to Sit: 1: +1 Total assist Sitting - Scoot to Edge of Bed: 1: +1 Total assist Details for Bed Mobility Assistance: Pt unable to assist to come to side of bed.  Pt kept asking to get into chair by way of stretcher. Transfers Transfers: Sit to Stand;Stand to Sit Sit to Stand: 1: +2 Total assist Sit to Stand: Patient Percentage: 30% Stand to Sit: 1: +2 Total assist Stand to Sit: Patient Percentage: 10% Details for Transfer Assistance: Pt in a lot of pain during transfer.  Required a great amount of assist.     Exercise     Balance Balance Balance Assessed: No   End of Session OT - End of Session Equipment Utilized During Treatment: Rolling walker;Gait belt Activity Tolerance: Patient limited by fatigue;Patient limited by pain Patient left: in chair;with call bell/phone within reach;with nursing/sitter in room Nurse Communication: Mobility status;Patient requests pain meds  GO     Hope Budds 01/26/2013, 11:40 AM [phone]

## 2013-01-26 NOTE — Progress Notes (Signed)
    Subjective:  Patient reports pain as mild  Objective:   VITALS:   Filed Vitals:   01/26/13 1116 01/26/13 1200 01/26/13 1300 01/26/13 1514  BP:   110/62   Pulse:   68   Temp:   97.6 F (36.4 C)   TempSrc:      Resp:  14 15 16   Height:      Weight:      SpO2: 100% 96% 100% 98%    Physical Exam  Dressing: C/D/I  Compartments soft  SILT DP/SP/S/S/T, 2+DP, +TA/GS/EHL  LABS  Results for orders placed during the hospital encounter of 01/24/13 (from the past 24 hour(s))  CBC     Status: Abnormal   Collection Time    01/26/13  7:10 AM      Result Value Range   WBC 10.2  4.0 - 10.5 K/uL   RBC 3.80 (*) 3.87 - 5.11 MIL/uL   Hemoglobin 11.7 (*) 12.0 - 15.0 g/dL   HCT 16.1 (*) 09.6 - 04.5 %   MCV 87.1  78.0 - 100.0 fL   MCH 30.8  26.0 - 34.0 pg   MCHC 35.3  30.0 - 36.0 g/dL   RDW 40.9  81.1 - 91.4 %   Platelets 117 (*) 150 - 400 K/uL  BASIC METABOLIC PANEL     Status: Abnormal   Collection Time    01/26/13  7:10 AM      Result Value Range   Sodium 144  135 - 145 mEq/L   Potassium 4.7  3.5 - 5.1 mEq/L   Chloride 112  96 - 112 mEq/L   CO2 20  19 - 32 mEq/L   Glucose, Bld 152 (*) 70 - 99 mg/dL   BUN 40 (*) 6 - 23 mg/dL   Creatinine, Ser 7.82 (*) 0.50 - 1.10 mg/dL   Calcium 8.3 (*) 8.4 - 10.5 mg/dL   GFR calc non Af Amer 29 (*) >90 mL/min   GFR calc Af Amer 34 (*) >90 mL/min     Assessment/Plan: 1 Day Post-Op   Active Problems:   Fracture of hip, right, closed   Dementia   HTN (hypertension)   AKI (acute kidney injury)   PLAN: Weight Bearing: WBAT Dressings: C/D/I, change to a dry dressing prior to D/c VTE prophylaxis: SCD's and ASA 325 daily Dispo: likely SNF.   Margarita Rana, D 01/26/2013, 6:18 PM   Margarita Rana, MD Cell [phone]

## 2013-01-26 NOTE — Evaluation (Signed)
Physical Therapy Evaluation Patient Details Name: Jacqueline Mccann MRN: [id number] DOB: [birth year] Today's Date: 01/26/2013 Time: 4098-1191 PT Time Calculation (min): 15 min  PT Assessment / Plan / Recommendation History of Present Illness  Pt is a 79 yo female who fell at her ALF and sustained at R hip fx and acute renal injury.  Pt underwent hip surgery with IM nail and has h/o HTN and dementia.  Clinical Impression  Pt assisted to recliner, pt did stand with RW, difficult to take steps . Recommend SNF. Pt will benefit from PT to address problems listed.     PT Assessment  Patient needs continued PT services    Follow Up Recommendations  SNF;Supervision/Assistance - 24 hour    Does the patient have the potential to tolerate intense rehabilitation      Barriers to Discharge        Equipment Recommendations  None recommended by PT    Recommendations for Other Services     Frequency Min 3X/week    Precautions / Restrictions Precautions Precautions: Fall Restrictions Weight Bearing Restrictions: Yes Other Position/Activity Restrictions: WBAT on the RLE   Pertinent Vitals/Pain RN notified for medication, pt c/o l hip+ pain.      Mobility  Bed Mobility Bed Mobility: Sitting - Scoot to Edge of Bed;Supine to Sit Supine to Sit: 1: +1 Total assist Sitting - Scoot to Edge of Bed: 1: +1 Total assist Details for Bed Mobility Assistance: Pt unable to assist to come to side of bed.  Pt kept asking to get into chair by way of stretcher. Transfers Sit to Stand: 1: +2 Total assist Sit to Stand: Patient Percentage: 30% Stand to Sit: 1: +2 Total assist Stand to Sit: Patient Percentage: 10% Details for Transfer Assistance: Pt in a lot of pain during transfer.  Required a great amount of assist. Ambulation/Gait Ambulation/Gait Assistance: Not tested (comment)    Exercises     PT Diagnosis: Difficulty walking;Generalized weakness;Acute pain  PT Problem List: Decreased  strength;Decreased range of motion;Decreased activity tolerance;Decreased mobility;Decreased balance;Decreased cognition;Decreased knowledge of use of DME;Decreased safety awareness;Decreased knowledge of precautions;Pain PT Treatment Interventions: DME instruction;Functional mobility training;Therapeutic activities;Therapeutic exercise;Patient/family education     PT Goals(Current goals can be found in the care plan section) Acute Rehab PT Goals Patient Stated Goal: none stated. PT Goal Formulation: Patient unable to participate in goal setting Time For Goal Achievement: 02/09/13 Potential to Achieve Goals: Fair  Visit Information  Last PT Received On: 01/26/13 Assistance Needed: +2 History of Present Illness: Pt is a 79 yo female who fell at her ALF and sustained at R hip fx and acute renal injury.  Pt underwent hip surgery with IM nail and has h/o HTN and dementia.       Prior Functioning  Home Living Family/patient expects to be discharged to:: Skilled nursing facility Prior Function Level of Independence: Needs assistance Gait / Transfers Assistance Needed: Pt states she walked alone w/o walker in ALF (unsure of accuracy) ADL's / Homemaking Assistance Needed: Chart states pt could do own basic self care at ALF.  Again, unsure of accuracy. Communication Communication: HOH Dominant Hand: Right    Cognition  Cognition Arousal/Alertness: Lethargic Behavior During Therapy: Anxious Overall Cognitive Status: No family/caregiver present to determine baseline cognitive functioning Area of Impairment: Orientation;Attention;Memory;Safety/judgement;Awareness;Problem solving Orientation Level: Disoriented to;Place Current Attention Level: Focused Memory: Decreased recall of precautions;Decreased short-term memory Safety/Judgement: Decreased awareness of safety;Decreased awareness of deficits Awareness: Intellectual Problem Solving: Slow processing;Decreased initiation;Requires verbal  cues;Requires tactile  cues General Comments: Pt with dementia per chart but unsure what he baseline cognitive status is.    Extremity/Trunk Assessment Upper Extremity Assessment Upper Extremity Assessment: Generalized weakness Lower Extremity Assessment Lower Extremity Assessment: Generalized weakness;RLE deficits/detail RLE Deficits / Details: decreased weight bearing on R leg but did take  2-3 steps. unable to lift leg . Cervical / Trunk Assessment Cervical / Trunk Assessment: Kyphotic   Balance Balance Balance Assessed: Yes Static Sitting Balance Static Sitting - Balance Support: Bilateral upper extremity supported Static Sitting - Level of Assistance: 3: Mod assist  End of Session PT - End of Session Activity Tolerance: Patient limited by pain Patient left: in chair;with call bell/phone within reach Nurse Communication: Mobility status  GP     Jacqueline Mccann, Jacqueline Mccann 01/26/2013, 2:33 PM Blanchard Kelch PT [phone]

## 2013-01-26 NOTE — Anesthesia Postprocedure Evaluation (Signed)
Anesthesia Post Note  Patient: Jacqueline Mccann  Procedure(s) Performed: Procedure(s) (LRB): INTRAMEDULLARY (IM) NAIL FEMORAL (Right)  Anesthesia type: general  Patient location: PACU  Post pain: Pain level controlled  Post assessment: Patient's Cardiovascular Status Stable  Post vital signs: Reviewed and stable  Level of consciousness: sedated  Complications: No apparent anesthesia complications

## 2013-01-26 NOTE — Progress Notes (Signed)
[facility] PROGRESS NOTE  Jacqueline Mccann UJW:[id number] DOB: 04-01-1920 DOA: 01/24/2013 PCP: No primary provider on file.  Assessment/Plan:  1-Fracture of hip, right, closed ;Patient S/P Intramedullary nail Femoral. Tolerated procedure well. Aspirin for DVT  prophylaxis. PT per ortho. Discontinue IV morphine to avoid oversedation.   2-Anemia: Patient received 2 units PRBC during surgery. HB at 11.   3-Dementia  4-HTN (hypertension) ; continue with Norvasc and metoprolol.  5-AKI (acute kidney injury); renal function improved. Decrease to 1.5 from 1.9 on admission.    Code Status: Full Code.  Family Communication:  Disposition Plan: to be determine.    Consultants:  Dr [name]  Procedures:  none  Antibiotics:  none  HPI/Subjective: Patient denies chest pain, dyspnea.  omplaining of leg pain.   Objective: Filed Vitals:   01/26/13 0547  BP: 134/65  Pulse:   Temp: 98 F (36.7 C)  Resp: 16    Intake/Output Summary (Last 24 hours) at 01/26/13 1248 Last data filed at 01/26/13 1100  Gross per 24 hour  Intake   3005 ml  Output   2175 ml  Net    830 ml   Filed Weights   01/24/13 1500  Weight: 50.803 kg (112 lb)    Exam:   General:  No distress.   Cardiovascular: S 1, S 2 RRR  Respiratory: CTA  Abdomen: BS present, soft, NT  Musculoskeletal: no edema.   Data Reviewed: Basic Metabolic Panel:  Recent Labs Lab 01/24/13 0950 01/25/13 0555 01/25/13 0925 01/26/13 0710  NA 142 142 143 144  K 4.9 5.1 4.8 4.7  CL 108 113* 113* 112  CO2 23 20 21 20   GLUCOSE 197* 139* 132* 152*  BUN 57* 60* 59* 40*  CREATININE 1.90* 1.92* 1.87* 1.51*  CALCIUM 9.3 7.9* 8.3* 8.3*   Liver Function Tests: No results found for this basename: AST, ALT, ALKPHOS, BILITOT, PROT, ALBUMIN,  in the last 168 hours No results found for this basename: LIPASE, AMYLASE,  in the last 168 hours No results found for this basename: AMMONIA,  in the last 168  hours CBC:  Recent Labs Lab 01/24/13 0950 01/25/13 0555 01/26/13 0710  WBC 10.7* 7.9 10.2  NEUTROABS 9.1*  --   --   HGB 10.5* 7.9* 11.7*  HCT 30.3* 22.3* 33.1*  MCV 88.9 89.2 87.1  PLT 210 156 117*   Cardiac Enzymes: No results found for this basename: CKTOTAL, CKMB, CKMBINDEX, TROPONINI,  in the last 168 hours BNP (last 3 results) No results found for this basename: PROBNP,  in the last 8760 hours CBG: No results found for this basename: GLUCAP,  in the last 168 hours  Recent Results (from the past 240 hour(s))  SURGICAL PCR SCREEN     Status: Abnormal   Collection Time    01/25/13  6:09 AM      Result Value Range Status   MRSA, PCR NEGATIVE  NEGATIVE Final   Staphylococcus aureus POSITIVE (*) NEGATIVE Final   Comment:            The Xpert SA Assay (FDA     approved for NASAL specimens     in patients over 96 years of age),     is one component of     a comprehensive surveillance     program.  Test performance has     been validated by [employer] for patients greater     than or equal to 29 year old.  It is not intended     to diagnose infection nor to     guide or monitor treatment.     Studies: Dg Hip Operative Right  February 22, 2013   CLINICAL DATA:  Femur fracture  EXAM: DG OPERATIVE RIGHT HIP  TECHNIQUE: A single spot fluoroscopic AP image of the right hip is submitted.  COMPARISON:  Yesterday  FINDINGS: The series of intraoperative images demonstrate placement of an intra medullary rod and dynamic compression screw transfixing an intertrochanteric proximal right femur fracture. The lesser trochanter remains displaced. Remainder of the bony fragments are anatomically aligned. There is 1 distal interlocking screw. Total knee arthroplasty is also present.  IMPRESSION: ORIF right intertrochanteric femur fracture.   Electronically Signed   By: Maryclare Bean M.D.   On: February 22, 2013 15:53    Scheduled Meds: . amLODipine  5 mg Oral Daily  . aspirin EC  325 mg Oral Q  breakfast  . clopidogrel  75 mg Oral Daily  . dorzolamide  1 drop Both Eyes BID  . feeding supplement (ENSURE COMPLETE)  237 mL Oral BID BM  . latanoprost  1 drop Both Eyes QHS  . metoprolol succinate  25 mg Oral Daily  . polyethylene glycol  17 g Oral Daily  . simvastatin  20 mg Oral QPM  . timolol  1 drop Both Eyes BID   Continuous Infusions: . lactated ringers 50 mL/hr at 2013-02-22 1343    Active Problems:   Fracture of hip, right, closed   Dementia   HTN (hypertension)   AKI (acute kidney injury)    Time spent: 35 minutes.     REGALADO,BELKYS  [facility] Pager [phone]. If 7PM-7AM, please contact night-coverage at [email or web address], password [facility] 01/26/2013, 12:48 PM  LOS: 2 days

## 2013-01-26 NOTE — Progress Notes (Signed)
Orthopedic Tech Progress Note Patient Details:  Jacqueline Mccann Oct 25, 1919 [id number]  Patient ID: [name], female   DOB: Dec 03, 1919, 79 y.o.   MRN: [id number]  Trapeze bar patient helper Nikki Dom 01/26/2013, 5:25 PM

## 2013-01-27 LAB — CBC
HCT: 28.3 % — ABNORMAL LOW (ref 36.0–46.0)
MCH: 31 pg (ref 26.0–34.0)
MCV: 86.8 fL (ref 78.0–100.0)
Platelets: 97 10*3/uL — ABNORMAL LOW (ref 150–400)
RDW: 13.9 % (ref 11.5–15.5)

## 2013-01-27 LAB — BASIC METABOLIC PANEL
BUN: 38 mg/dL — ABNORMAL HIGH (ref 6–23)
Calcium: 8.2 mg/dL — ABNORMAL LOW (ref 8.4–10.5)
Chloride: 108 mEq/L (ref 96–112)
Creatinine, Ser: 1.51 mg/dL — ABNORMAL HIGH (ref 0.50–1.10)
GFR calc Af Amer: 34 mL/min — ABNORMAL LOW (ref >90)
GFR calc non Af Amer: 29 mL/min — ABNORMAL LOW (ref >90)
Glucose, Bld: 120 mg/dL — ABNORMAL HIGH (ref 70–99)
Potassium: 4.4 mEq/L (ref 3.5–5.1)

## 2013-01-27 MED ORDER — DOCUSATE SODIUM 100 MG PO CAPS
100.0000 mg | ORAL_CAPSULE | Freq: Two times a day (BID) | ORAL | Status: DC
  Administered 2013-01-27 – 2013-01-29 (×5): 100 mg via ORAL
  Filled 2013-01-27 (×4): qty 1

## 2013-01-27 MED ORDER — TAMSULOSIN HCL 0.4 MG PO CAPS
0.4000 mg | ORAL_CAPSULE | Freq: Every day | ORAL | Status: DC
  Administered 2013-01-27 – 2013-01-29 (×3): 0.4 mg via ORAL
  Filled 2013-01-27 (×3): qty 1

## 2013-01-27 NOTE — Progress Notes (Signed)
Pt only able to void 50-100 cc  x1 after 12 hrs, scan showed 400 cc, I&O cath obtained 350 at 0520 this am. Pt  also had I&O cath yesterday at 1600.

## 2013-01-27 NOTE — Progress Notes (Signed)
Physical Therapy Treatment Patient Details Name: Jacqueline Mccann MRN: [id number] DOB: [birth year] Today's Date: 01/27/2013 Time: 1191-4782 PT Time Calculation (min): 18 min  PT Assessment / Plan / Recommendation  History of Present Illness Pt is a 79 yo female who fell at her ALF and sustained at R hip fx and acute renal injury.  Pt underwent hip surgery with IM nail and has h/o HTN and dementia.   PT Comments   Pt progressing with mobility with increased participation today with mobility.  Follow Up Recommendations  SNF;Supervision/Assistance - 24 hour     Equipment Recommendations  None recommended by PT       Frequency Min 3X/week   Progress towards PT Goals Progress towards PT goals: Progressing toward goals  Plan Current plan remains appropriate    Precautions / Restrictions Precautions Precautions: Fall Restrictions Other Position/Activity Restrictions: WBAT on the RLE       Mobility  Bed Mobility Supine to Sit: 3: Mod assist;HOB elevated Sitting - Scoot to Edge of Bed: 4: Min assist Details for Bed Mobility Assistance: with increased time pt able to assist with sitting up to edge of bed. required multimodal cues for technique. Transfers Transfers: Squat Pivot Transfers Sit to Stand: 1: +2 Total assist;From bed;From elevated surface;With upper extremity assist Sit to Stand: Patient Percentage: 50% Stand to Sit: 1: +2 Total assist;To chair/3-in-1;With upper extremity assist Stand to Sit: Patient Percentage: 20% Squat Pivot Transfers: 1: +2 Total assist Squat Pivot Transfers: Patient Percentage: 20% Details for Transfer Assistance: cues for hand placement with sit/stand transfers. Pt able to assist with moving RW with stand/pivot transfer, however was not able to advance feet toward chair, needed increased assistance to safely sit in recliner. Ambulation/Gait Ambulation/Gait Assistance: Not tested (comment)      PT Goals (current goals can now be found in the  care plan section) Acute Rehab PT Goals Patient Stated Goal: none stated. PT Goal Formulation: Patient unable to participate in goal setting Time For Goal Achievement: 02/09/13 Potential to Achieve Goals: Fair  Visit Information  Last PT Received On: 01/27/13 Assistance Needed: +2 History of Present Illness: Pt is a 79 yo female who fell at her ALF and sustained at R hip fx and acute renal injury.  Pt underwent hip surgery with IM nail and has h/o HTN and dementia.    Subjective Data  Patient Stated Goal: none stated.   Cognition  Cognition Arousal/Alertness: Awake/alert Behavior During Therapy: WFL for tasks assessed/performed Overall Cognitive Status: Within Functional Limits for tasks assessed Area of Impairment: Safety/judgement;Problem solving Orientation Level: Disoriented to;Place;Time Current Attention Level: Sustained Safety/Judgement: Decreased awareness of safety Awareness: Emergent Problem Solving: Difficulty sequencing;Decreased initiation;Requires verbal cues;Requires tactile cues General Comments: Pt with dementia per chart but unsure what he baseline cognitive status is. 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       End of Session PT - End of Session Equipment Utilized During Treatment: Gait belt Activity Tolerance: Patient limited by pain Patient left: in chair;with call bell/phone within reach Nurse Communication: Mobility status   GP     Sallyanne Kuster 01/27/2013, 12:58 PM  Sallyanne Kuster, PTA Office- [phone]

## 2013-01-27 NOTE — Progress Notes (Signed)
Clinical Social Work Department BRIEF PSYCHOSOCIAL ASSESSMENT 01/27/2013  Patient:  Jacqueline Mccann, Jacqueline Mccann     Account Number:  [id number]     Admit date:  01/24/2013  Clinical Social Worker:  Hendricks Milo  Date/Time:  01/27/2013 06:14 PM  Referred by:  Physician  Date Referred:  01/27/2013 Referred for  SNF Placement   Other Referral:   Interview type:  Family Other interview type:   Patient's daughter Jacqueline Mccann [phone] cell [phone] home.    PSYCHOSOCIAL DATA Living Status:  FACILITY Admitted from facility:  [facility] Level of care:  Independent Living Primary support name:  Jacqueline Mccann Primary support relationship to patient:  CHILD, ADULT Degree of support available:   Very supportive, at bedside.    CURRENT CONCERNS  Other Concerns:    SOCIAL WORK ASSESSMENT / PLAN Clinical Social Worker (CSW) met with patient's daughter at bedside to discuss SNF placement. Daughter was agreeable to SNF search in [place]. Daughter reported that her preference is [facility] and she does not want [employer]. CSW encouraged daughter to pick a few back up facilities incase [facility] does not have availability at the time of D/C. Patient was agreeable to look into a back up plan.   Assessment/plan status:  Psychosocial Support/Ongoing Assessment of Needs Other assessment/ plan:   Information/referral to community resources:   CSW gave daughter SNF list.    PATIENT'S/FAMILY'S RESPONSE TO PLAN OF CARE: Daughter was thankful for CSW visit.

## 2013-01-27 NOTE — Progress Notes (Signed)
Subjective: 2 Days Post-Op Procedure(s) (LRB): INTRAMEDULLARY (IM) NAIL FEMORAL (Right) Patient reports pain as 3 on 0-10 scale.    Objective: Vital signs in last 24 hours: Temp:  [97.6 F (36.4 C)-98.7 F (37.1 C)] 98.7 F (37.1 C) (10/18 0455) Pulse Rate:  [66-68] 66 (10/18 0455) Resp:  [14-16] 16 (10/18 0455) BP: (110-155)/(47-76) 155/76 mmHg (10/18 0455) SpO2:  [96 %-100 %] 97 % (10/18 0455)  Intake/Output from previous day: 10/17 0701 - 10/18 0700 In: 810 [P.O.:810] Out: 650 [Urine:650] Intake/Output this shift:     Recent Labs  01/24/13 0950 01/25/13 0555 01/26/13 0710 01/27/13 0600  HGB 10.5* 7.9* 11.7* 10.1*    Recent Labs  01/26/13 0710 01/27/13 0600  WBC 10.2 9.0  RBC 3.80* 3.26*  HCT 33.1* 28.3*  PLT 117* 97*    Recent Labs  01/26/13 0710 01/27/13 0600  NA 144 138  K 4.7 4.4  CL 112 108  CO2 20 22  BUN 40* 38*  CREATININE 1.51* 1.51*  GLUCOSE 152* 120*  CALCIUM 8.3* 8.2*    Recent Labs  01/24/13 0950  INR 1.17    ABD soft Neurovascular intact Sensation intact distally Intact pulses distally Dorsiflexion/Plantar flexion intact Incision: dressing C/D/I  Assessment/Plan: 2 Days Post-Op Procedure(s) (LRB): INTRAMEDULLARY (IM) NAIL FEMORAL (Right) Advance diet Up with therapy Discharge to SNF Social work will be contacted again today in terms of placement for this individual.    Lorimer Tiberio J 01/27/2013, 9:25 AM

## 2013-01-27 NOTE — Progress Notes (Signed)
[facility] PROGRESS NOTE  Jerrie Schussler [place] XBJ:[id number] DOB: October 30, 1944 DOA: 01/24/2013 PCP: No primary provider on file.  Assessment/Plan: 1-Fracture of hip, right, closed ;Patient S/P Intramedullary nail Femoral. Tolerated procedure well. Aspirin for DVT  prophylaxis. PT per ortho. Discontinue IV morphine to avoid oversedation.   2-Anemia: Patient received 2 units PRBC during surgery. HB at 10.  3-Dementia; at baseline.  4-HTN (hypertension) ; continue with Norvasc and metoprolol.  5-AKI (acute kidney injury) on CKD; renal function improved. Decrease to 1.5 from 1.9 on admission.  6-Urine retention: start Flomax. If continue to retain will place foley.  7-Thrombocytopenia: likely increase consumption. Monitor. Not on Lovenox.   Code Status: Full Code.  Family Communication:  Disposition Plan: SW consulted for SNF. SNF when bed available.    Consultants:  Dr [name]  Procedures:  none  Antibiotics:  none  HPI/Subjective: Patient denies chest pain, dyspnea.  omplaining of leg pain.   Objective: Filed Vitals:   01/27/13 0455  BP: 155/76  Pulse: 66  Temp: 98.7 F (37.1 C)  Resp: 16    Intake/Output Summary (Last 24 hours) at 01/27/13 0822 Last data filed at 01/27/13 0529  Gross per 24 hour  Intake    690 ml  Output    650 ml  Net     40 ml   Filed Weights   01/24/13 1500  Weight: 50.803 kg (112 lb)    Exam:   General:  No distress.   Cardiovascular: S 1, S 2 RRR  Respiratory: CTA  Abdomen: BS present, soft, NT  Musculoskeletal: no edema.   Data Reviewed: Basic Metabolic Panel:  Recent Labs Lab 01/24/13 0950 01/25/13 0555 01/25/13 0925 01/26/13 0710 01/27/13 0600  NA 142 142 143 144 138  K 4.9 5.1 4.8 4.7 4.4  CL 108 113* 113* 112 108  CO2 23 20 21 20 22   GLUCOSE 197* 139* 132* 152* 120*  BUN 57* 60* 59* 40* 38*  CREATININE 1.90* 1.92* 1.87* 1.51* 1.51*  CALCIUM 9.3 7.9* 8.3* 8.3* 8.2*   Liver Function Tests: No  results found for this basename: AST, ALT, ALKPHOS, BILITOT, PROT, ALBUMIN,  in the last 168 hours No results found for this basename: LIPASE, AMYLASE,  in the last 168 hours No results found for this basename: AMMONIA,  in the last 168 hours CBC:  Recent Labs Lab 01/24/13 0950 01/25/13 0555 01/26/13 0710 01/27/13 0600  WBC 10.7* 7.9 10.2 9.0  NEUTROABS 9.1*  --   --   --   HGB 10.5* 7.9* 11.7* 10.1*  HCT 30.3* 22.3* 33.1* 28.3*  MCV 88.9 89.2 87.1 86.8  PLT 210 156 117* 97*   Cardiac Enzymes: No results found for this basename: CKTOTAL, CKMB, CKMBINDEX, TROPONINI,  in the last 168 hours BNP (last 3 results) No results found for this basename: PROBNP,  in the last 8760 hours CBG: No results found for this basename: GLUCAP,  in the last 168 hours  Recent Results (from the past 240 hour(s))  SURGICAL PCR SCREEN     Status: Abnormal   Collection Time    01/25/13  6:09 AM      Result Value Range Status   MRSA, PCR NEGATIVE  NEGATIVE Final   Staphylococcus aureus POSITIVE (*) NEGATIVE Final   Comment:            The Xpert SA Assay (FDA     approved for NASAL specimens     in patients over 77 years of age),  is one component of     a comprehensive surveillance     program.  Test performance has     been validated by [facility] for patients greater     than or equal to 5 year old.     It is not intended     to diagnose infection nor to     guide or monitor treatment.     Studies: Dg Hip Operative Right  02/02/2013   CLINICAL DATA:  Femur fracture  EXAM: DG OPERATIVE RIGHT HIP  TECHNIQUE: A single spot fluoroscopic AP image of the right hip is submitted.  COMPARISON:  Yesterday  FINDINGS: The series of intraoperative images demonstrate placement of an intra medullary rod and dynamic compression screw transfixing an intertrochanteric proximal right femur fracture. The lesser trochanter remains displaced. Remainder of the bony fragments are anatomically aligned. There  is 1 distal interlocking screw. Total knee arthroplasty is also present.  IMPRESSION: ORIF right intertrochanteric femur fracture.   Electronically Signed   By: Maryclare Bean M.D.   On: 02-02-2013 15:53    Scheduled Meds: . amLODipine  5 mg Oral Daily  . aspirin EC  325 mg Oral Q breakfast  . clopidogrel  75 mg Oral Daily  . dorzolamide  1 drop Both Eyes BID  . feeding supplement (ENSURE COMPLETE)  237 mL Oral BID BM  . latanoprost  1 drop Both Eyes QHS  . metoprolol succinate  25 mg Oral Daily  . polyethylene glycol  17 g Oral Daily  . simvastatin  20 mg Oral QPM  . timolol  1 drop Both Eyes BID   Continuous Infusions: . lactated ringers 50 mL/hr at 02-02-2013 1343    Active Problems:   Fracture of hip, right, closed   Dementia   HTN (hypertension)   AKI (acute kidney injury)    Time spent: 35 minutes.     Cornelius Schuitema  [facility] Pager [phone]. If 7PM-7AM, please contact night-coverage at [email or web address], password [facility] 01/27/2013, 8:22 AM  LOS: 3 days

## 2013-01-27 NOTE — Progress Notes (Addendum)
Clinical Social Work Department CLINICAL SOCIAL WORK PLACEMENT NOTE 01/27/2013  Patient:  Jacqueline Mccann, Jacqueline Mccann  Account Number:  [id number] Admit date:  01/24/2013  Clinical Social Worker:  Jetta Lout, Theresia Majors  Date/time:  01/27/2013 06:23 PM  Clinical Social Work is seeking post-discharge placement for this patient at the following level of care:   SKILLED NURSING   (*CSW will update this form in Epic as items are completed)   01/27/2013  Patient/family provided with [facility] Department of Clinical Social Work's list of facilities offering this level of care within the geographic area requested by the patient (or if unable, by the patient's family).  01/27/2013  Patient/family informed of their freedom to choose among providers that offer the needed level of care, that participate in Medicare, Medicaid or managed care program needed by the patient, have an available bed and are willing to accept the patient.  01/27/2013  Patient/family informed of [facility]' ownership interest in [facility], as well as of the fact that they are under no obligation to receive care at this facility.  PASARR submitted to EDS on  PASARR number received from EDS on   FL2 transmitted to all facilities in geographic area requested by pt/family on  01/27/2013 FL2 transmitted to all facilities within larger geographic area on   Patient informed that his/her managed care company has contracts with or will negotiate with  certain facilities, including the following:     Patient/family informed of bed offers received:  01/29/2013 Patient chooses bed at [facility] Physician recommends and patient chooses bed at    Patient to be transferred to  on  01/29/2013 Patient to be transferred to facility by [facility]  The following physician request were entered in Epic:   Additional Comments: Daughter's first choice is [facility] and does not want [facility].

## 2013-01-28 LAB — CBC
MCHC: 34.8 g/dL (ref 30.0–36.0)
MCV: 87.9 fL (ref 78.0–100.0)
Platelets: 116 10*3/uL — ABNORMAL LOW (ref 150–400)
RBC: 3.4 MIL/uL — ABNORMAL LOW (ref 3.87–5.11)
RDW: 13.8 % (ref 11.5–15.5)
WBC: 8.6 10*3/uL (ref 4.0–10.5)

## 2013-01-28 LAB — URINALYSIS, ROUTINE W REFLEX MICROSCOPIC
Bilirubin Urine: NEGATIVE
Ketones, ur: NEGATIVE mg/dL
Nitrite: NEGATIVE
Specific Gravity, Urine: 1.012 (ref 1.005–1.030)
Urobilinogen, UA: 0.2 mg/dL (ref 0.0–1.0)
pH: 5 (ref 5.0–8.0)

## 2013-01-28 LAB — BASIC METABOLIC PANEL
BUN: 38 mg/dL — ABNORMAL HIGH (ref 6–23)
Calcium: 8.1 mg/dL — ABNORMAL LOW (ref 8.4–10.5)
Chloride: 108 mEq/L (ref 96–112)
Creatinine, Ser: 1.29 mg/dL — ABNORMAL HIGH (ref 0.50–1.10)
GFR calc non Af Amer: 35 mL/min — ABNORMAL LOW (ref >90)
Glucose, Bld: 139 mg/dL — ABNORMAL HIGH (ref 70–99)
Potassium: 4.8 mEq/L (ref 3.5–5.1)

## 2013-01-28 LAB — URINE MICROSCOPIC-ADD ON

## 2013-01-28 MED ORDER — ASPIRIN 325 MG PO TBEC: 325.0000 mg | DELAYED_RELEASE_TABLET | Freq: Every day | ORAL | Status: DC

## 2013-01-28 MED ORDER — ENSURE COMPLETE PO LIQD: 237.0000 mL | Freq: Two times a day (BID) | ORAL | Status: DC

## 2013-01-28 MED ORDER — DSS 100 MG PO CAPS: 100.0000 mg | ORAL_CAPSULE | Freq: Two times a day (BID) | ORAL | Status: AC

## 2013-01-28 MED ORDER — TAMSULOSIN HCL 0.4 MG PO CAPS: 0.4000 mg | ORAL_CAPSULE | Freq: Every day | ORAL | Status: DC

## 2013-01-28 NOTE — Progress Notes (Signed)
Dr. Sunnie Nielsen aware of pt's bladder scan between an incontinent episode.  Ordered to scan the bladder in one hour and if residual >350cc then to place foley.

## 2013-01-28 NOTE — Progress Notes (Signed)
Subjective: 3 Days Post-Op Procedure(s) (LRB): INTRAMEDULLARY (IM) NAIL FEMORAL (Right) Patient reports pain as patient has very little pain at rest   significant pain with any motion.    Objective: Vital signs in last 24 hours: Temp:  [98.4 F (36.9 C)-99.1 F (37.3 C)] 98.4 F (36.9 C) (10/19 0418) Pulse Rate:  [73-93] 93 (10/19 0418) Resp:  [16-18] 16 (10/19 0835) BP: (106-127)/(42-47) 127/47 mmHg (10/19 1019) SpO2:  [97 %-100 %] 100 % (10/19 0835)  Intake/Output from previous day: 10/18 0701 - 10/19 0700 In: 960 [P.O.:960] Out: 525 [Urine:525] Intake/Output this shift: Total I/O In: 240 [P.O.:240] Out: -    Recent Labs  01/26/13 0710 01/27/13 0600 01/28/13 0340  HGB 11.7* 10.1* 10.4*    Recent Labs  01/27/13 0600 01/28/13 0340  WBC 9.0 8.6  RBC 3.26* 3.40*  HCT 28.3* 29.9*  PLT 97* 116*    Recent Labs  01/27/13 0600 01/28/13 0340  NA 138 138  K 4.4 4.8  CL 108 108  CO2 22 20  BUN 38* 38*  CREATININE 1.51* 1.29*  GLUCOSE 120* 139*  CALCIUM 8.2* 8.1*   No results found for this basename: LABPT, INR,  in the last 72 hours  patient is 3+ assist to the bedside commode./  she is incontinent to bladder wounds are dressed without significant drainage  Assessment/Plan: 3 Days Post-Op Procedure(s) (LRB): INTRAMEDULLARY (IM) NAIL FEMORAL (Right) Up with therapy Waiting on SNF placement  Vernisha Bacote J 01/28/2013, 10:37 AM

## 2013-01-28 NOTE — Discharge Summary (Signed)
Physician Discharge Summary  Jacqueline Mccann ZOX:[id number] DOB: November 18, 1919 DOA: 01/24/2013  PCP: No primary provider on file.  Admit date: 01/24/2013 Discharge date: 01/28/2013  Time spent: 35 minutes  Recommendations for Outpatient Follow-up:  1. Need B-met to follow renal function. 2. Need to follow up with Dr [name] in 2 weeks.   Discharge Diagnoses:    Fracture of hip, right, closed   Dementia   HTN (hypertension)   AKI (acute kidney injury)   Discharge Condition: Stable.   Diet recommendation: Heart Healthy  Filed Weights   01/24/13 1500  Weight: 50.803 kg (112 lb)    History of present illness:  Who is from an ALF. She fell this AM in her room. She was found by EMS to have shortening and external rotation of her right lower ext. No headache, no dizziness, no weakness, no change in vision.  She normally walks with rolling walker.  In the ER, she was found to have a right intertrochanteric fracture and orthopedics was consulted. Her kidney function was slightly elevated. Baseline not certain. No cardiac history per family- await EKG   Hospital Course:  1-Fracture of hip, right, closed ;Patient S/P Intramedullary nail Femoral on 10-16. Tolerated procedure well. Aspirin for DVT prophylaxis. PT per ortho. Discontinue IV morphine to avoid oversedation. Patient stable to be transfer to SNF.  2-Anemia: Patient received 2 units PRBC during surgery. HB at 11.  3-Dementia ; stable. pleasantly confused.  4-HTN (hypertension) ; continue with Norvasc and metoprolol.  5-AKI (acute kidney injury); renal function improved. Decrease to 1.2 from 1.9 on admission. NSL IV fluids.      Procedures: INTRAMEDULLARY (IM) NAIL FEMORAL 10-16   Consultations:  Dr. [name]  Discharge Exam: Filed Vitals:   01/28/13 1121  BP:   Pulse:   Temp:   Resp: 18    General: No distress.  Cardiovascular: S 1, S 2 RRR Respiratory: CTA  Discharge Instructions  Discharge Orders   Future Orders Complete By Expires   Diet - low sodium heart healthy  As directed    Weight bearing as tolerated  As directed    Questions:     Laterality:     Extremity:         Medication List    STOP taking these medications       aspirin 81 MG tablet  Replaced by:  aspirin 325 MG EC tablet     losartan 100 MG tablet  Commonly known as:  COZAAR      TAKE these medications       amLODipine 5 MG tablet  Commonly known as:  NORVASC  Take 5 mg by mouth daily.     aspirin 325 MG EC tablet  Take 1 tablet (325 mg total) by mouth daily with breakfast.     clopidogrel 75 MG tablet  Commonly known as:  PLAVIX  Take 75 mg by mouth daily.     dorzolamide 2 % ophthalmic solution  Commonly known as:  TRUSOPT  Place 1 drop into both eyes 2 (two) times daily.     DSS 100 MG Caps  Take 100 mg by mouth 2 (two) times daily.     feeding supplement (ENSURE COMPLETE) Liqd  Take 237 mLs by mouth 2 (two) times daily between meals.     HYDROcodone-acetaminophen 5-325 MG per tablet  Commonly known as:  NORCO  Take 2 tablets by mouth every 4 (four) hours as needed for pain.     latanoprost 0.005 %  ophthalmic solution  Commonly known as:  XALATAN  1 drop at bedtime.     meclizine 25 MG tablet  Commonly known as:  ANTIVERT  Take 25 mg by mouth 3 (three) times daily as needed for dizziness.     metoprolol succinate 25 MG 24 hr tablet  Commonly known as:  TOPROL-XL  Take 25 mg by mouth daily.     polyethylene glycol packet  Commonly known as:  MIRALAX / GLYCOLAX  Take 17 g by mouth daily.     simvastatin 20 MG tablet  Commonly known as:  ZOCOR  Take 20 mg by mouth every evening.     tamsulosin 0.4 MG Caps capsule  Commonly known as:  FLOMAX  Take 1 capsule (0.4 mg total) by mouth daily.     timolol 0.25 % ophthalmic solution  Commonly known as:  TIMOPTIC  Place 1 drop into both eyes 2 (two) times daily.       No Known Allergies     Follow-up Information   Follow up  with MURPHY, TIMOTHY, D, MD. Schedule an appointment as soon as possible for a visit in 2 weeks.   Specialty:  Orthopedic Surgery   Contact information:   [address] [place] [address] [phone]        The results of significant diagnostics from this hospitalization (including imaging, microbiology, ancillary and laboratory) are listed below for reference.    Significant Diagnostic Studies: Dg Chest 1 View  01/24/2013   CLINICAL DATA:  Fall  EXAM: CHEST - 1 VIEW  COMPARISON:  10/02/2010  FINDINGS: Heart size and vascularity are normal. Lungs are clear without infiltrate effusion or mass. No change from the prior study.  IMPRESSION: No active disease.   Electronically Signed   By: Marlan Palau M.D.   On: 01/24/2013 10:41   Dg Hip Complete Right  01/24/2013   CLINICAL DATA:  Fall. Right hip pain  EXAM: RIGHT HIP - COMPLETE 2+ VIEW  COMPARISON:  None.  FINDINGS: Intertrochanteric fracture right femur with angulation. Right hip joint appears normal. No other fracture.  IMPRESSION: Angulated intertrochanteric fracture on the right.   Electronically Signed   By: Marlan Palau M.D.   On: 01/24/2013 10:42   Dg Hip Operative Right  01/25/2013   CLINICAL DATA:  Femur fracture  EXAM: DG OPERATIVE RIGHT HIP  TECHNIQUE: A single spot fluoroscopic AP image of the right hip is submitted.  COMPARISON:  Yesterday  FINDINGS: The series of intraoperative images demonstrate placement of an intra medullary rod and dynamic compression screw transfixing an intertrochanteric proximal right femur fracture. The lesser trochanter remains displaced. Remainder of the bony fragments are anatomically aligned. There is 1 distal interlocking screw. Total knee arthroplasty is also present.  IMPRESSION: ORIF right intertrochanteric femur fracture.   Electronically Signed   By: Maryclare Bean M.D.   On: 01/25/2013 15:53    Microbiology: Recent Results (from the past 240 hour(s))  SURGICAL PCR SCREEN      Status: Abnormal   Collection Time    01/25/13  6:09 AM      Result Value Range Status   MRSA, PCR NEGATIVE  NEGATIVE Final   Staphylococcus aureus POSITIVE (*) NEGATIVE Final   Comment:            The Xpert SA Assay (FDA     approved for NASAL specimens     in patients over 15 years of age),     is one  component of     a comprehensive surveillance     program.  Test performance has     been validated by [facility] for patients greater     than or equal to 80 year old.     It is not intended     to diagnose infection nor to     guide or monitor treatment.     Labs: Basic Metabolic Panel:  Recent Labs Lab 01/25/13 0555 01/25/13 0925 01/26/13 0710 01/27/13 0600 01/28/13 0340  NA 142 143 144 138 138  K 5.1 4.8 4.7 4.4 4.8  CL 113* 113* 112 108 108  CO2 20 21 20 22 20   GLUCOSE 139* 132* 152* 120* 139*  BUN 60* 59* 40* 38* 38*  CREATININE 1.92* 1.87* 1.51* 1.51* 1.29*  CALCIUM 7.9* 8.3* 8.3* 8.2* 8.1*   Liver Function Tests: No results found for this basename: AST, ALT, ALKPHOS, BILITOT, PROT, ALBUMIN,  in the last 168 hours No results found for this basename: LIPASE, AMYLASE,  in the last 168 hours No results found for this basename: AMMONIA,  in the last 168 hours CBC:  Recent Labs Lab 01/24/13 0950 01/25/13 0555 01/26/13 0710 01/27/13 0600 01/28/13 0340  WBC 10.7* 7.9 10.2 9.0 8.6  NEUTROABS 9.1*  --   --   --   --   HGB 10.5* 7.9* 11.7* 10.1* 10.4*  HCT 30.3* 22.3* 33.1* 28.3* 29.9*  MCV 88.9 89.2 87.1 86.8 87.9  PLT 210 156 117* 97* 116*   Cardiac Enzymes: No results found for this basename: CKTOTAL, CKMB, CKMBINDEX, TROPONINI,  in the last 168 hours BNP: BNP (last 3 results) No results found for this basename: PROBNP,  in the last 8760 hours CBG: No results found for this basename: GLUCAP,  in the last 168 hours     Signed:  Inez Stantz  [facility] 01/28/2013, 12:35 PM

## 2013-01-29 ENCOUNTER — Encounter (HOSPITAL_COMMUNITY): Payer: Self-pay | Admitting: Orthopedic Surgery

## 2013-01-29 LAB — URINE CULTURE
Colony Count: NO GROWTH
Culture: NO GROWTH

## 2013-01-29 NOTE — Progress Notes (Signed)
Physical Therapy Treatment Patient Details Name: Jacqueline Mccann MRN: [id number] DOB: [birth year] Today's Date: 01/29/2013 Time: 1030-1053 PT Time Calculation (min): 23 min  PT Assessment / Plan / Recommendation  History of Present Illness Pt is a 79 yo female who fell at her ALF and sustained at R hip fx and acute renal injury.  Pt underwent hip surgery with IM nail and has h/o HTN and dementia.   PT Comments   Pt progressing slowly with mobility but she was able to take ~3-4 steps forwards today.  Limited by pain & weakness.     Follow Up Recommendations  SNF;Supervision/Assistance - 24 hour     Does the patient have the potential to tolerate intense rehabilitation     Barriers to Discharge        Equipment Recommendations  None recommended by PT    Recommendations for Other Services    Frequency Min 3X/week   Progress towards PT Goals Progress towards PT goals: Progressing toward goals (slowly)  Plan Current plan remains appropriate    Precautions / Restrictions Precautions Precautions: Fall Restrictions Weight Bearing Restrictions: Yes RLE Weight Bearing: Weight bearing as tolerated   Pertinent Vitals/Pain C/o Rt LE pain with WBing.  Did not rate.  Repositioned for comfort.     Mobility  Bed Mobility Bed Mobility: Supine to Sit;Sitting - Scoot to Edge of Bed Supine to Sit: 2: Max assist;HOB elevated;With rails Sitting - Scoot to Edge of Bed: 3: Mod assist Details for Bed Mobility Assistance: Max directional cues for sequencing & technique.  (A) for LE's, to lift shoulders/trunk to sitting upright, & use of draw pad to pivot/scoot hips closer to EOB.   Transfers Transfers: Sit to Stand;Stand to Sit Sit to Stand: 1: +2 Total assist;With upper extremity assist;From bed Sit to Stand: Patient Percentage: 50% Stand to Sit: 1: +2 Total assist;To chair/3-in-1;With armrests Stand to Sit: Patient Percentage: 20% Details for Transfer Assistance: cues for hand placement &  technique.  Pt leaving hands on RW despite cues to reach back with stand>sit.   (A) to achieve standing, shift weight anteriorly, & controlled descent.   Ambulation/Gait Ambulation/Gait Assistance: 3: Mod assist (+2 to follow with recliner) Ambulation Distance (Feet): 3-4 steps Assistive device: Rolling walker Ambulation/Gait Assistance Details: cues for sequencing, RW management, weight shifting to Rt to allow LLE advancement.  (A) for balance/stability due to pt's Rt LE buckles with stance phase.   Gait Pattern: Step-to pattern;Decreased step length - right;Decreased step length - left;Decreased weight shift to right;Decreased hip/knee flexion - right;Decreased hip/knee flexion - left;Antalgic;Narrow base of support Stairs: No Wheelchair Mobility Wheelchair Mobility: No    Exercises General Exercises - Lower Extremity Ankle Circles/Pumps: AROM;Both;10 reps Long Arc Quad: AAROM;Strengthening;Right;10 reps;Seated Hip Flexion/Marching: AAROM;Strengthening;Right;10 reps;Seated     PT Goals (current goals can now be found in the care plan section) Acute Rehab PT Goals Time For Goal Achievement: 02/09/13 Potential to Achieve Goals: Fair  Visit Information  Last PT Received On: 01/29/13 Assistance Needed: +2 History of Present Illness: Pt is a 79 yo female who fell at her ALF and sustained at R hip fx and acute renal injury.  Pt underwent hip surgery with IM nail and has h/o HTN and dementia.    Subjective Data      Cognition  Cognition Arousal/Alertness: Awake/alert Behavior During Therapy: WFL for tasks assessed/performed Overall Cognitive Status: Within Functional Limits for tasks assessed    Balance     End of Session PT - End  of Session Equipment Utilized During Treatment: Gait belt Activity Tolerance: Patient limited by fatigue;Patient limited by pain Patient left: in chair;with call bell/phone within reach Nurse Communication: Mobility status   GP     Lara Mulch 01/29/2013, 11:38 AM   Verdell Face, PTA [phone] 01/29/2013

## 2013-01-29 NOTE — Progress Notes (Signed)
Clinical social worker assisted with patient discharge to skilled nursing facility, [facility].  CSW addressed all family questions and concerns. CSW copied chart and added all important documents. CSW also set up patient transportation with [facility]. Clinical Social Worker will sign off for now as social work intervention is no longer needed.   Neil Errickson, MSW, LCSWA [phone] 

## 2013-01-29 NOTE — Discharge Summary (Signed)
Physician Discharge Summary  Jacqueline Mccann VQQ:[id number] DOB: 1919/05/23 DOA: 01/24/2013  PCP: No primary provider on file.  Admit date: 01/24/2013 Discharge date: 01/29/2013  Time spent: 35 minutes  Recommendations for Outpatient Follow-up:  1. Need B-met to follow renal function. 2. Need to follow up with Dr [name] in 2 weeks.  3. Need voiding trial in 3 days.   Discharge Diagnoses:    Fracture of hip, right, closed   Dementia   HTN (hypertension)   AKI (acute kidney injury)   Urinary retention   Discharge Condition: Stable.   Diet recommendation: Heart Healthy  Filed Weights   01/24/13 1500  Weight: 50.803 kg (112 lb)    History of present illness:  Who is from an ALF. She fell this AM in her room. She was found by EMS to have shortening and external rotation of her right lower ext. No headache, no dizziness, no weakness, no change in vision.  She normally walks with rolling walker.  In the ER, she was found to have a right intertrochanteric fracture and orthopedics was consulted. Her kidney function was slightly elevated. Baseline not certain. No cardiac history per family- await EKG   Hospital Course:  1-Fracture of hip, right, closed ;Patient S/P Intramedullary nail Femoral on 10-16. Tolerated procedure well. Aspirin for DVT prophylaxis. PT per ortho. Discontinue IV morphine to avoid oversedation. Patient stable to be transfer to SNF.  2-Anemia: Patient received 2 units PRBC during surgery. HB at 11.  3-Dementia ; stable. pleasantly confused.  4-HTN (hypertension) ; continue with Norvasc and metoprolol.  5-AKI (acute kidney injury); renal function improved. Decrease to 1.2 from 1.9 on admission. NSL IV fluids. Patient with urine retention. To avoid worsening renal function patient will be discharge with foley catheter. Continue with flomax. Need voiding trial in 3 to 5 days.      Procedures: INTRAMEDULLARY (IM) NAIL FEMORAL 10-16   Consultations:  Dr.  [name]  Discharge Exam: Filed Vitals:   01/29/13 0538  BP: 125/47  Pulse: 88  Temp: 98.6 F (37 C)  Resp:     General: No distress.  Cardiovascular: S 1, S 2 RRR Respiratory: CTA  Discharge Instructions      Discharge Orders   Future Orders Complete By Expires   Diet - low sodium heart healthy  As directed    Weight bearing as tolerated  As directed    Questions:     Laterality:     Extremity:         Medication List    STOP taking these medications       aspirin 81 MG tablet  Replaced by:  aspirin 325 MG EC tablet     losartan 100 MG tablet  Commonly known as:  COZAAR      TAKE these medications       amLODipine 5 MG tablet  Commonly known as:  NORVASC  Take 5 mg by mouth daily.     aspirin 325 MG EC tablet  Take 1 tablet (325 mg total) by mouth daily with breakfast.     clopidogrel 75 MG tablet  Commonly known as:  PLAVIX  Take 75 mg by mouth daily.     dorzolamide 2 % ophthalmic solution  Commonly known as:  TRUSOPT  Place 1 drop into both eyes 2 (two) times daily.     DSS 100 MG Caps  Take 100 mg by mouth 2 (two) times daily.     feeding supplement (ENSURE COMPLETE) Liqd  Take 237 mLs by mouth 2 (two) times daily between meals.     HYDROcodone-acetaminophen 5-325 MG per tablet  Commonly known as:  NORCO  Take 2 tablets by mouth every 4 (four) hours as needed for pain.     latanoprost 0.005 % ophthalmic solution  Commonly known as:  XALATAN  1 drop at bedtime.     meclizine 25 MG tablet  Commonly known as:  ANTIVERT  Take 25 mg by mouth 3 (three) times daily as needed for dizziness.     metoprolol succinate 25 MG 24 hr tablet  Commonly known as:  TOPROL-XL  Take 25 mg by mouth daily.     polyethylene glycol packet  Commonly known as:  MIRALAX / GLYCOLAX  Take 17 g by mouth daily.     simvastatin 20 MG tablet  Commonly known as:  ZOCOR  Take 20 mg by mouth every evening.     tamsulosin 0.4 MG Caps capsule  Commonly known as:   FLOMAX  Take 1 capsule (0.4 mg total) by mouth daily.     timolol 0.25 % ophthalmic solution  Commonly known as:  TIMOPTIC  Place 1 drop into both eyes 2 (two) times daily.       No Known Allergies Follow-up Information   Follow up with MURPHY, TIMOTHY, D, MD. Schedule an appointment as soon as possible for a visit in 2 weeks.   Specialty:  Orthopedic Surgery   Contact information:   [address] [place] [address] [phone]        The results of significant diagnostics from this hospitalization (including imaging, microbiology, ancillary and laboratory) are listed below for reference.    Significant Diagnostic Studies: Dg Chest 1 View  01/24/2013   CLINICAL DATA:  Fall  EXAM: CHEST - 1 VIEW  COMPARISON:  10/02/2010  FINDINGS: Heart size and vascularity are normal. Lungs are clear without infiltrate effusion or mass. No change from the prior study.  IMPRESSION: No active disease.   Electronically Signed   By: Marlan Palau M.D.   On: 01/24/2013 10:41   Dg Hip Complete Right  01/24/2013   CLINICAL DATA:  Fall. Right hip pain  EXAM: RIGHT HIP - COMPLETE 2+ VIEW  COMPARISON:  None.  FINDINGS: Intertrochanteric fracture right femur with angulation. Right hip joint appears normal. No other fracture.  IMPRESSION: Angulated intertrochanteric fracture on the right.   Electronically Signed   By: Marlan Palau M.D.   On: 01/24/2013 10:42   Dg Hip Operative Right  01/25/2013   CLINICAL DATA:  Femur fracture  EXAM: DG OPERATIVE RIGHT HIP  TECHNIQUE: A single spot fluoroscopic AP image of the right hip is submitted.  COMPARISON:  Yesterday  FINDINGS: The series of intraoperative images demonstrate placement of an intra medullary rod and dynamic compression screw transfixing an intertrochanteric proximal right femur fracture. The lesser trochanter remains displaced. Remainder of the bony fragments are anatomically aligned. There is 1 distal interlocking screw. Total knee  arthroplasty is also present.  IMPRESSION: ORIF right intertrochanteric femur fracture.   Electronically Signed   By: Maryclare Bean M.D.   On: 01/25/2013 15:53    Microbiology: Recent Results (from the past 240 hour(s))  SURGICAL PCR SCREEN     Status: Abnormal   Collection Time    01/25/13  6:09 AM      Result Value Range Status   MRSA, PCR NEGATIVE  NEGATIVE Final   Staphylococcus aureus POSITIVE (*) NEGATIVE Final  Comment:            The Xpert SA Assay (FDA     approved for NASAL specimens     in patients over 58 years of age),     is one component of     a comprehensive surveillance     program.  Test performance has     been validated by [employer] for patients greater     than or equal to 63 year old.     It is not intended     to diagnose infection nor to     guide or monitor treatment.     Labs: Basic Metabolic Panel:  Recent Labs Lab 01/25/13 0555 01/25/13 0925 01/26/13 0710 01/27/13 0600 01/28/13 0340  NA 142 143 144 138 138  K 5.1 4.8 4.7 4.4 4.8  CL 113* 113* 112 108 108  CO2 20 21 20 22 20   GLUCOSE 139* 132* 152* 120* 139*  BUN 60* 59* 40* 38* 38*  CREATININE 1.92* 1.87* 1.51* 1.51* 1.29*  CALCIUM 7.9* 8.3* 8.3* 8.2* 8.1*   Liver Function Tests: No results found for this basename: AST, ALT, ALKPHOS, BILITOT, PROT, ALBUMIN,  in the last 168 hours No results found for this basename: LIPASE, AMYLASE,  in the last 168 hours No results found for this basename: AMMONIA,  in the last 168 hours CBC:  Recent Labs Lab 01/24/13 0950 01/25/13 0555 01/26/13 0710 01/27/13 0600 01/28/13 0340  WBC 10.7* 7.9 10.2 9.0 8.6  NEUTROABS 9.1*  --   --   --   --   HGB 10.5* 7.9* 11.7* 10.1* 10.4*  HCT 30.3* 22.3* 33.1* 28.3* 29.9*  MCV 88.9 89.2 87.1 86.8 87.9  PLT 210 156 117* 97* 116*   Cardiac Enzymes: No results found for this basename: CKTOTAL, CKMB, CKMBINDEX, TROPONINI,  in the last 168 hours BNP: BNP (last 3 results) No results found for this  basename: PROBNP,  in the last 8760 hours CBG: No results found for this basename: GLUCAP,  in the last 168 hours     Signed:  REGALADO,BELKYS  [facility] 01/29/2013, 8:32 AM

## 2013-01-29 NOTE — Progress Notes (Signed)
    Subjective:  Patient reports pain as mild  Objective:   VITALS:   Filed Vitals:   01/29/13 0538 01/29/13 0800 01/29/13 1110 01/29/13 1115  BP: 125/47   110/48  Pulse: 88   78  Temp: 98.6 F (37 C)     TempSrc: Oral     Resp:  16 18   Height:      Weight:      SpO2: 100% 100%      Physical Exam  Dressing: C/D/I  Compartments soft  SILT DP/SP/S/S/T, 2+DP, +TA/GS/EHL  LABS  Results for orders placed during the hospital encounter of 01/24/13 (from the past 24 hour(s))  URINALYSIS, ROUTINE W REFLEX MICROSCOPIC     Status: Abnormal   Collection Time    01/28/13 11:25 PM      Result Value Range   Color, Urine YELLOW  YELLOW   APPearance CLEAR  CLEAR   Specific Gravity, Urine 1.012  1.005 - 1.030   pH 5.0  5.0 - 8.0   Glucose, UA NEGATIVE  NEGATIVE mg/dL   Hgb urine dipstick SMALL (*) NEGATIVE   Bilirubin Urine NEGATIVE  NEGATIVE   Ketones, ur NEGATIVE  NEGATIVE mg/dL   Protein, ur NEGATIVE  NEGATIVE mg/dL   Urobilinogen, UA 0.2  0.0 - 1.0 mg/dL   Nitrite NEGATIVE  NEGATIVE   Leukocytes, UA TRACE (*) NEGATIVE  URINE MICROSCOPIC-ADD ON     Status: None   Collection Time    01/28/13 11:25 PM      Result Value Range   Squamous Epithelial / LPF RARE  RARE   WBC, UA 3-6  <3 WBC/hpf   RBC / HPF 3-6  <3 RBC/hpf   Bacteria, UA RARE  RARE     Assessment/Plan: 4 Days Post-Op   Active Problems:   Fracture of hip, right, closed   Dementia   HTN (hypertension)   AKI (acute kidney injury)   PLAN: Weight Bearing: WBAT Dressings: keep C/D/I VTE prophylaxis: SCD's and ASA 325 Dispo: [facility]   Martin, Raad Clayson, D 01/29/2013, 11:56 AM   Margarita Rana, MD Cell [phone]

## 2013-04-30 ENCOUNTER — Encounter (HOSPITAL_COMMUNITY): Payer: Self-pay | Admitting: Emergency Medicine

## 2013-04-30 ENCOUNTER — Emergency Department (HOSPITAL_COMMUNITY): Payer: PRIVATE HEALTH INSURANCE

## 2013-04-30 ENCOUNTER — Emergency Department (HOSPITAL_COMMUNITY)
Admission: EM | Admit: 2013-04-30 | Discharge: 2013-04-30 | Disposition: A | Discharge status: Home / Self Care | Payer: PRIVATE HEALTH INSURANCE | Attending: Emergency Medicine | Admitting: Emergency Medicine

## 2013-04-30 DIAGNOSIS — Z8673 Personal history of transient ischemic attack (TIA), and cerebral infarction without residual deficits: Secondary | ICD-10-CM | POA: Insufficient documentation

## 2013-04-30 DIAGNOSIS — Z7982 Long term (current) use of aspirin: Secondary | ICD-10-CM | POA: Insufficient documentation

## 2013-04-30 DIAGNOSIS — W1809XA Striking against other object with subsequent fall, initial encounter: Secondary | ICD-10-CM | POA: Insufficient documentation

## 2013-04-30 DIAGNOSIS — Y9389 Activity, other specified: Secondary | ICD-10-CM | POA: Insufficient documentation

## 2013-04-30 DIAGNOSIS — Y9210 Unspecified residential institution as the place of occurrence of the external cause: Secondary | ICD-10-CM | POA: Insufficient documentation

## 2013-04-30 DIAGNOSIS — S1093XA Contusion of unspecified part of neck, initial encounter: Principal | ICD-10-CM

## 2013-04-30 DIAGNOSIS — S0003XA Contusion of scalp, initial encounter: Secondary | ICD-10-CM | POA: Insufficient documentation

## 2013-04-30 DIAGNOSIS — F0390 Unspecified dementia without behavioral disturbance: Secondary | ICD-10-CM | POA: Insufficient documentation

## 2013-04-30 DIAGNOSIS — E785 Hyperlipidemia, unspecified: Secondary | ICD-10-CM | POA: Insufficient documentation

## 2013-04-30 DIAGNOSIS — I10 Essential (primary) hypertension: Secondary | ICD-10-CM | POA: Insufficient documentation

## 2013-04-30 DIAGNOSIS — Z7902 Long term (current) use of antithrombotics/antiplatelets: Secondary | ICD-10-CM | POA: Insufficient documentation

## 2013-04-30 DIAGNOSIS — S0083XA Contusion of other part of head, initial encounter: Secondary | ICD-10-CM | POA: Diagnosis present

## 2013-04-30 DIAGNOSIS — W19XXXA Unspecified fall, initial encounter: Secondary | ICD-10-CM | POA: Diagnosis present

## 2013-04-30 DIAGNOSIS — Z79899 Other long term (current) drug therapy: Secondary | ICD-10-CM | POA: Insufficient documentation

## 2013-04-30 DIAGNOSIS — N390 Urinary tract infection, site not specified: Secondary | ICD-10-CM | POA: Diagnosis present

## 2013-04-30 DIAGNOSIS — IMO0002 Reserved for concepts with insufficient information to code with codable children: Secondary | ICD-10-CM | POA: Insufficient documentation

## 2013-04-30 DIAGNOSIS — Z87448 Personal history of other diseases of urinary system: Secondary | ICD-10-CM | POA: Insufficient documentation

## 2013-04-30 LAB — BASIC METABOLIC PANEL
BUN: 23 mg/dL (ref 6–23)
CHLORIDE: 106 meq/L (ref 96–112)
CO2: 25 mEq/L (ref 19–32)
Calcium: 9.3 mg/dL (ref 8.4–10.5)
Creatinine, Ser: 1.36 mg/dL — ABNORMAL HIGH (ref 0.50–1.10)
GFR calc Af Amer: 38 mL/min — ABNORMAL LOW (ref >90)
GFR calc non Af Amer: 33 mL/min — ABNORMAL LOW (ref >90)
GLUCOSE: 98 mg/dL (ref 70–99)
Potassium: 4.9 mEq/L (ref 3.7–5.3)
SODIUM: 143 meq/L (ref 137–147)

## 2013-04-30 LAB — CBC WITH DIFFERENTIAL/PLATELET
BASOS PCT: 0 % (ref 0–1)
Basophils Absolute: 0 10*3/uL (ref 0.0–0.1)
Eosinophils Absolute: 0.1 10*3/uL (ref 0.0–0.7)
Eosinophils Relative: 1 % (ref 0–5)
HCT: 33.9 % — ABNORMAL LOW (ref 36.0–46.0)
HEMOGLOBIN: 11.8 g/dL — AB (ref 12.0–15.0)
LYMPHS PCT: 19 % (ref 12–46)
Lymphs Abs: 1 10*3/uL (ref 0.7–4.0)
MCH: 31.2 pg (ref 26.0–34.0)
MCHC: 34.8 g/dL (ref 30.0–36.0)
MCV: 89.7 fL (ref 78.0–100.0)
MONOS PCT: 10 % (ref 3–12)
Monocytes Absolute: 0.6 10*3/uL (ref 0.1–1.0)
NEUTROS ABS: 3.8 10*3/uL (ref 1.7–7.7)
NEUTROS PCT: 70 % (ref 43–77)
Platelets: DECREASED 10*3/uL (ref 150–400)
RBC: 3.78 MIL/uL — ABNORMAL LOW (ref 3.87–5.11)
RDW: 15.1 % (ref 11.5–15.5)
Smear Review: DECREASED
WBC: 5.5 10*3/uL (ref 4.0–10.5)

## 2013-04-30 LAB — URINALYSIS W MICROSCOPIC + REFLEX CULTURE
BILIRUBIN URINE: NEGATIVE
Glucose, UA: NEGATIVE mg/dL
Hgb urine dipstick: NEGATIVE
Ketones, ur: NEGATIVE mg/dL
Nitrite: POSITIVE — AB
PH: 7 (ref 5.0–8.0)
Protein, ur: NEGATIVE mg/dL
Specific Gravity, Urine: 1.011 (ref 1.005–1.030)
Urobilinogen, UA: 0.2 mg/dL (ref 0.0–1.0)

## 2013-04-30 MED ORDER — ACETAMINOPHEN 325 MG PO TABS
650.0000 mg | ORAL_TABLET | Freq: Once | ORAL | Status: AC
  Administered 2013-04-30: 650 mg via ORAL
  Filled 2013-04-30: qty 2

## 2013-04-30 MED ORDER — CEFPODOXIME PROXETIL 200 MG PO TABS: 200.0000 mg | ORAL_TABLET | Freq: Two times a day (BID) | ORAL | Status: DC

## 2013-04-30 MED ORDER — DEXTROSE 5 % IV SOLN
1.0000 g | Freq: Once | INTRAVENOUS | Status: AC
  Administered 2013-04-30: 1 g via INTRAVENOUS
  Filled 2013-04-30: qty 10

## 2013-04-30 NOTE — ED Notes (Signed)
MD at bedside. 

## 2013-04-30 NOTE — ED Notes (Signed)
Pt states she came with a purse and glassess. Per EMT pt did not have them when placed in room. Pt has baseline dementia. Pt reassured she did not arrive with them. Pt states they are on the chair in the living room next door.

## 2013-04-30 NOTE — ED Notes (Signed)
Patient transported to CT 

## 2013-04-30 NOTE — ED Notes (Signed)
Dr Harrison at bedside

## 2013-04-30 NOTE — ED Notes (Addendum)
Patient fell at nursing home this am, per EMS patient hit her head on bed frame, no LOC reported, denies head and neck pain, patient is on plavix, large hematoma present on left forehead, hx of demenita

## 2013-04-30 NOTE — ED Notes (Signed)
Patient transported to X-ray 

## 2013-04-30 NOTE — ED Provider Notes (Signed)
CSN: [id number]     Arrival date & time 04/30/13  0827 History   First MD Initiated Contact with Patient 04/30/13 0831     Chief Complaint  Patient presents with  . Fall   (Consider location/radiation/quality/duration/timing/severity/associated sxs/prior Treatment) Patient is a 80 y.o. female presenting with fall. The history is provided by the patient.  Fall This is a new problem. The current episode started less than 1 hour ago. Episode frequency: once. The problem has been resolved. Pertinent negatives include no chest pain, no abdominal pain, no headaches and no shortness of breath. Nothing aggravates the symptoms. Nothing relieves the symptoms. She has tried nothing for the symptoms. The treatment provided no relief.    Past Medical History  Diagnosis Date  . Hypertension   . Dementia   . Stroke   . Renal disorder   . Renal failure   . Hyperlipidemia    Past Surgical History  Procedure Laterality Date  . Femur im nail Right 01/25/2013    Procedure: INTRAMEDULLARY (IM) NAIL FEMORAL;  Surgeon: Sheral Apley, MD;  Location: [facility] OR;  Service: Orthopedics;  Laterality: Right;   No family history on file. History  Substance Use Topics  . Smoking status: Unknown If Ever Smoked  . Smokeless tobacco: Not on file  . Alcohol Use: Not on file   OB History   Grav Para Term Preterm Abortions TAB SAB Ect Mult Living                 Review of Systems  Constitutional: Negative for fever and fatigue.  HENT: Negative for congestion and drooling.   Eyes: Negative for pain.  Respiratory: Negative for cough and shortness of breath.   Cardiovascular: Negative for chest pain.  Gastrointestinal: Negative for nausea, vomiting, abdominal pain and diarrhea.  Genitourinary: Negative for dysuria and hematuria.  Musculoskeletal: Negative for back pain, gait problem and neck pain.  Skin: Negative for color change.  Neurological: Negative for dizziness and headaches.  Hematological: Negative  for adenopathy.  Psychiatric/Behavioral: Negative for behavioral problems.  All other systems reviewed and are negative.    Allergies  Review of patient's allergies indicates no known allergies.  Home Medications   Current Outpatient Rx  Name  Route  Sig  Dispense  Refill  . amLODipine (NORVASC) 5 MG tablet   Oral   Take 5 mg by mouth daily.         Marland Kitchen aspirin EC 325 MG EC tablet   Oral   Take 1 tablet (325 mg total) by mouth daily with breakfast.   30 tablet   0   . clopidogrel (PLAVIX) 75 MG tablet   Oral   Take 75 mg by mouth daily.         Marland Kitchen docusate sodium 100 MG CAPS   Oral   Take 100 mg by mouth 2 (two) times daily.   10 capsule   0   . dorzolamide (TRUSOPT) 2 % ophthalmic solution   Both Eyes   Place 1 drop into both eyes 2 (two) times daily.         . feeding supplement, ENSURE COMPLETE, (ENSURE COMPLETE) LIQD   Oral   Take 237 mLs by mouth 2 (two) times daily between meals.   30 Bottle   0   . HYDROcodone-acetaminophen (NORCO) 5-325 MG per tablet   Oral   Take 2 tablets by mouth every 4 (four) hours as needed for pain.   60 tablet   0   .  latanoprost (XALATAN) 0.005 % ophthalmic solution      1 drop at bedtime.         . meclizine (ANTIVERT) 25 MG tablet   Oral   Take 25 mg by mouth 3 (three) times daily as needed for dizziness.         . metoprolol succinate (TOPROL-XL) 25 MG 24 hr tablet   Oral   Take 25 mg by mouth daily.         . polyethylene glycol (MIRALAX / GLYCOLAX) packet   Oral   Take 17 g by mouth daily.         . simvastatin (ZOCOR) 20 MG tablet   Oral   Take 20 mg by mouth every evening.         . tamsulosin (FLOMAX) 0.4 MG CAPS capsule   Oral   Take 1 capsule (0.4 mg total) by mouth daily.   10 capsule   0   . timolol (TIMOPTIC) 0.25 % ophthalmic solution   Both Eyes   Place 1 drop into both eyes 2 (two) times daily.          BP 158/96  Pulse 90  Temp(Src) 99.3 F (37.4 C) (Oral)  Resp 18   Ht 5\' 2"  (1.575 m)  Wt 112 lb (50.803 kg)  BMI 20.48 kg/m2  SpO2 97% Physical Exam  Nursing note and vitals reviewed. Constitutional: She appears well-developed and well-nourished.  HENT:  Head: Normocephalic.  Mouth/Throat: Oropharynx is clear and moist. No oropharyngeal exudate.  Moderate-sized hematoma to the left for head. Mild abrasion over this area.  No other focal tenderness to palpation of the face.  Eyes: Conjunctivae and EOM are normal. Pupils are equal, round, and reactive to light.  Neck: Normal range of motion. Neck supple.  No ttp of vertebrae.   Cardiovascular: Normal rate, regular rhythm, normal heart sounds and intact distal pulses.  Exam reveals no gallop and no friction rub.   No murmur heard. Pulmonary/Chest: Effort normal and breath sounds normal. No respiratory distress. She has no wheezes.  Abdominal: Soft. Bowel sounds are normal. There is no tenderness. There is no rebound and no guarding.  Musculoskeletal: Normal range of motion. She exhibits no edema and no tenderness.  Mild nonfocal tenderness to palpation of the right shoulder and right wrist.  Normal range of motion of bilateral hips.  Neurological: She is alert. She has normal strength. No sensory deficit.  A/o x 2. Does not know year.   Skin: Skin is warm and dry.  Psychiatric: She has a normal mood and affect. Her behavior is normal.    ED Course  Procedures (including critical care time) Labs Review Labs Reviewed  CBC WITH DIFFERENTIAL - Abnormal; Notable for the following:    RBC 3.78 (*)    Hemoglobin 11.8 (*)    HCT 33.9 (*)    All other components within normal limits  BASIC METABOLIC PANEL - Abnormal; Notable for the following:    Creatinine, Ser 1.36 (*)    GFR calc non Af Amer 33 (*)    GFR calc Af Amer 38 (*)    All other components within normal limits  URINALYSIS W MICROSCOPIC + REFLEX CULTURE - Abnormal; Notable for the following:    Nitrite POSITIVE (*)    Leukocytes, UA  MODERATE (*)    Bacteria, UA MANY (*)    All other components within normal limits  URINE CULTURE   Imaging Review Dg Chest 1 View  04/30/2013  CLINICAL DATA:  Fall.  Hypertension.  Dementia.  EXAM: CHEST - 1 VIEW  COMPARISON:  01/24/2013  FINDINGS: The heart size and mediastinal contours are within normal limits. Both lungs are clear. The visualized skeletal structures are unremarkable.  IMPRESSION: No active disease.   Electronically Signed   By: Myles Rosenthal M.D.   On: 04/30/2013 10:16   Dg Pelvis 1-2 Views  04/30/2013   CLINICAL DATA:  Fall.  Right hip pain.  Old fracture.  EXAM: PELVIS - 1-2 VIEW  COMPARISON:  DG ABDOMEN 1V dated 10/05/2010; CT ABDOMEN W/O CM dated 03/04/2009; DG HIP OPERATIVE*R* dated 01/25/2013; DG HIP COMPLETE*R* dated 01/24/2013  FINDINGS: Intramedullary hip screw on the right noted traversing the intertrochanteric fracture. Lesser trochanteric fragment again noted. There is a new double bony contour along the lateral cortex of the proximal femoral meta diaphysis. There is considerable axial loss of articular space in the right hip joint, with moderate to prominent axial loss of articular space in the left hip joint.  Prominence of colonic stool noted in the pelvis. Lower lumbar spondylosis.  IMPRESSION: 1. New double bony contour along the lateral noted diaphysis of the right proximal femur. This may just be an unusually prominent skin fold, but I cannot exclude oblique fracture along the intramedullary nail component of the hip screw. Dedicated hip and femoral imaging recommended. 2. Considerable degenerative arthropathy of both hips with axial loss of articular space. 3. Probable constipation. 4. Lower lumbar spondylosis.   Electronically Signed   By: Herbie Baltimore M.D.   On: 04/30/2013 10:26   Dg Shoulder Right  04/30/2013   CLINICAL DATA:  Fall.  Right shoulder discomfort.  EXAM: RIGHT SHOULDER - 2+ VIEW  COMPARISON:  DG CHEST 1 VIEW dated 01/24/2013  FINDINGS:  Flattening and loss of volume in the right humeral head noted with advanced degenerative glenohumeral arthropathy. Extensive subacromial spurring with probable fragmented osteophytes. Degenerative AC joint arthropathy noted. No dislocation is observed. The findings primarily appear chronic, I doubt that there is an acute fracture.  Bony demineralization noted.  IMPRESSION: 1. Degenerative glenohumeral arthropathy with some flattening and volume loss in the humeral head, and chronic spurring in the glenohumeral joint, AC joint, and subacromial region. No dislocation or definite fracture observed.   Electronically Signed   By: Herbie Baltimore M.D.   On: 04/30/2013 10:11   Dg Wrist Complete Right  04/30/2013   CLINICAL DATA:  Fall.  Right wrist tenderness.  EXAM: RIGHT WRIST - COMPLETE 3+ VIEW  COMPARISON:  None.  FINDINGS: Bony demineralization noted with degenerative arthropathy in the lateral carpus and at the first carpometacarpal articulation.  Bony ridging is present along the dorsum of the demineralized distal radius, but I do not see a well-defined fracture plane. Vascular calcifications noted.  IMPRESSION: 1. Degenerative findings especially in the lateral carpus and first carpometacarpal articulation. No definite fracture. If pain persists despite conservative therapy, CT or MRI may be warranted for further characterization.   Electronically Signed   By: Herbie Baltimore M.D.   On: 04/30/2013 10:14   Dg Hip Complete Right  04/30/2013   CLINICAL DATA:  Fall.  Right hip pain.  EXAM: RIGHT HIP - COMPLETE 2+ VIEW  COMPARISON:  01/25/2013  FINDINGS: Intramedullary nail and fixation rod are again seen transfixing a right femoral neck fracture. There has been further impaction at the fracture site since prior study, with some extrusion of the intramedullary nail laterally with associated chronic periostitis.  No acute hip fracture  visualized. No evidence of dislocation. Peripheral vascular calcification  osteopenia again noted.  IMPRESSION: No acute fracture or dislocation.  Healing right femoral neck fracture shows further impaction since the prior postop exam, with mild lateral migration of the intramedullary nail associated with chronic periostitis along the lateral aspect of the proximal femur.   Electronically Signed   By: Myles RosenthalJohn  Stahl M.D.   On: 04/30/2013 13:12   Ct Head Wo Contrast  04/30/2013   CLINICAL DATA:  Fall.  EXAM: CT HEAD WITHOUT CONTRAST  TECHNIQUE: Contiguous axial images were obtained from the base of the skull through the vertex without intravenous contrast.  COMPARISON:  CT 11/12/2011.  FINDINGS: No mass. No hydrocephalus. No hemorrhage. 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 Chronic atrophy and white matter small vessel ischemic change. Paranasal sinuses and orbits are unremarkable. Mastoids are clear. No acute bony abnormality. Soft tissue swelling over the left frontal region. No underlying fracture.  IMPRESSION: Soft tissue swelling left frontal region. No underlying fracture or acute intracranial abnormality. Old right posterior parietal infarct.   Electronically Signed   By: Maisie Fushomas  Register   On: 04/30/2013 11:23   Ct Cervical Spine Wo Contrast  04/30/2013   CLINICAL DATA:  Fall.  EXAM: CT CERVICAL SPINE WITHOUT CONTRAST  TECHNIQUE: Multidetector CT imaging of the cervical spine was performed without intravenous contrast. Multiplanar CT image reconstructions were also generated.  COMPARISON:  None.  FINDINGS: Diffuse severe degenerative changes at the cervical spine. Straightening of cervical spine is present consistent with a degenerative disease present. There is no evidence of fracture or dislocation. Shotty cervical lymph nodes are noted. Nodular thyroid is present. Thyroid ultrasound should be considered for further evaluation. Carotid atherosclerotic vascular calcifications present.  IMPRESSION: 1. Severe cervical spine degenerative change. No fracture or  dislocation. 2. Nodular thyroid gland. Thyroid ultrasound can be obtained for further evaluation. 3. Carotid atherosclerotic vascular disease.   Electronically Signed   By: Maisie Fushomas  Register   On: 04/30/2013 15:17    EKG Interpretation    Date/Time:  Monday April 30 2013 08:33:19 EST Ventricular Rate:  91 PR Interval:  144 QRS Duration: 78 QT Interval:  351 QTC Calculation: 432 R Axis:   51 Text Interpretation:  Sinus rhythm Abnormal R-wave progression, early transition Baseline wander in lead(s) I No significant change since last tracing Confirmed by Yamari Ventola  MD, Sidni Fusco ([id number]) on 04/30/2013 8:39:24 AM            MDM   1. Traumatic hematoma of forehead   2. Fall   3. UTI (lower urinary tract infection)    8:47 AM 80 y.o. female who presents with mechanical fall from her nursing home. EMS is reporting that the patient got tangled up in her bed sheets were getting a set tripped hitting her head on the bed frame. No loss of consciousness was reported. The patient is on Plavix and has a stable non-enlarging hematoma to the left for head. She is afebrile and vital signs are unremarkable here. She has a history of dementia is alert and oriented x2. Will get screening labwork to the history of renal failure as well as screening imaging.  3:23 PM: I interpreted/reviewed the labs and/or imaging. UA concerning for UTI. Rocephin given here, will send home w/ vantin. Imaging non-contrib. Pt continues to appear well.  I have discussed the diagnosis/risks/treatment options with the patient and believe the pt to be eligible for discharge home to follow-up with pcp as needed. We also discussed returning to  the ED immediately if new or worsening sx occur. We discussed the sx which are most concerning (e.g., worsening pain) that necessitate immediate return. Any new prescriptions provided to the patient are listed below.  New Prescriptions   CEFPODOXIME (VANTIN) 200 MG TABLET    Take 1 tablet (200  mg total) by mouth 2 (two) times daily.     Junius Argyle, MD 04/30/13 1622

## 2013-05-02 LAB — URINE CULTURE: Colony Count: >=100000

## 2013-05-14 ENCOUNTER — Emergency Department (HOSPITAL_COMMUNITY): Payer: PRIVATE HEALTH INSURANCE

## 2013-05-14 ENCOUNTER — Encounter (HOSPITAL_COMMUNITY): Payer: Self-pay | Admitting: Emergency Medicine

## 2013-05-14 ENCOUNTER — Emergency Department (HOSPITAL_COMMUNITY)
Admission: EM | Admit: 2013-05-14 | Discharge: 2013-05-14 | Disposition: A | Discharge status: Home / Self Care | Payer: PRIVATE HEALTH INSURANCE | Attending: Emergency Medicine | Admitting: Emergency Medicine

## 2013-05-14 DIAGNOSIS — Z7982 Long term (current) use of aspirin: Secondary | ICD-10-CM | POA: Insufficient documentation

## 2013-05-14 DIAGNOSIS — Y9389 Activity, other specified: Secondary | ICD-10-CM | POA: Insufficient documentation

## 2013-05-14 DIAGNOSIS — S139XXA Sprain of joints and ligaments of unspecified parts of neck, initial encounter: Secondary | ICD-10-CM | POA: Insufficient documentation

## 2013-05-14 DIAGNOSIS — S161XXA Strain of muscle, fascia and tendon at neck level, initial encounter: Secondary | ICD-10-CM

## 2013-05-14 DIAGNOSIS — W19XXXA Unspecified fall, initial encounter: Secondary | ICD-10-CM

## 2013-05-14 DIAGNOSIS — S7000XA Contusion of unspecified hip, initial encounter: Secondary | ICD-10-CM | POA: Insufficient documentation

## 2013-05-14 DIAGNOSIS — Z87448 Personal history of other diseases of urinary system: Secondary | ICD-10-CM | POA: Insufficient documentation

## 2013-05-14 DIAGNOSIS — IMO0002 Reserved for concepts with insufficient information to code with codable children: Secondary | ICD-10-CM | POA: Insufficient documentation

## 2013-05-14 DIAGNOSIS — I10 Essential (primary) hypertension: Secondary | ICD-10-CM | POA: Insufficient documentation

## 2013-05-14 DIAGNOSIS — Z79899 Other long term (current) drug therapy: Secondary | ICD-10-CM | POA: Insufficient documentation

## 2013-05-14 DIAGNOSIS — E785 Hyperlipidemia, unspecified: Secondary | ICD-10-CM | POA: Insufficient documentation

## 2013-05-14 DIAGNOSIS — S0990XA Unspecified injury of head, initial encounter: Secondary | ICD-10-CM | POA: Insufficient documentation

## 2013-05-14 DIAGNOSIS — Z792 Long term (current) use of antibiotics: Secondary | ICD-10-CM | POA: Insufficient documentation

## 2013-05-14 DIAGNOSIS — Y9210 Unspecified residential institution as the place of occurrence of the external cause: Secondary | ICD-10-CM | POA: Insufficient documentation

## 2013-05-14 DIAGNOSIS — Z8673 Personal history of transient ischemic attack (TIA), and cerebral infarction without residual deficits: Secondary | ICD-10-CM | POA: Insufficient documentation

## 2013-05-14 DIAGNOSIS — W06XXXA Fall from bed, initial encounter: Secondary | ICD-10-CM | POA: Insufficient documentation

## 2013-05-14 DIAGNOSIS — Z7902 Long term (current) use of antithrombotics/antiplatelets: Secondary | ICD-10-CM | POA: Insufficient documentation

## 2013-05-14 DIAGNOSIS — F0390 Unspecified dementia without behavioral disturbance: Secondary | ICD-10-CM | POA: Insufficient documentation

## 2013-05-14 DIAGNOSIS — S300XXA Contusion of lower back and pelvis, initial encounter: Secondary | ICD-10-CM

## 2013-05-14 NOTE — Discharge Instructions (Signed)
Fall Prevention in Hospitals  As a hospital patient, your condition and the treatments you receive can increase your risk for falls. Some additional risk factors for falls in a hospital include:   Being in an unfamiliar environment.   Being on bed rest.   Your surgery.   Taking certain medicines.   Your tubing requirements, such as intravenous (IV) therapy or catheters.  It is important that you learn how to decrease fall risks while at the hospital. Below are important tips that can help prevent falls.  SAFETY TIPS FOR PREVENTING FALLS  Talk about your risk of falling.   Ask your caregiver why you are at risk for falling. Is it your medicine, illness, tubing placement, or something else?   Make a plan with your caregiver to keep you safe from falls.   Ask your caregiver or pharmacist about side effect of your medicines. Some medicines can make you dizzy or affect your coordination.  Ask for help.   Ask for help before getting out of bed. You may need to press your call button.   Ask for assistance in getting you safely to the toilet.   Ask for a walker or cane to be put at your bedside. Ask that most of the side rails on your bed be placed up before your caregiver leaves the room.   Ask family or friends to sit with you.   Ask for things that are out of your reach, such as your glasses, hearing aids, telephone, bedside table, or call button.  Follow these tips to avoid falling:   Stay lying or seated, rather than standing, while waiting for help.   Wear rubber-soled slippers or shoes whenever you walk in the hospital.   Avoid quick, sudden movements.   Change positions slowly.   Sit on the side of your bed before standing.   Stand up slowly and wait before you start to walk.   Let your caregiver know if there is a spill on the floor.   Pay careful attention to the medical equipment, electrical cords, and tubes around you.   When you need help, use your call button by your bed or in the  bathroom. Wait for one of your caregivers to help you.   If you feel dizzy or unsure of your footing, return to bed and wait for assistance.   Avoid being distracted by the TV, telephone, or another person in your room.   Do not lean or support yourself on rolling objects, such as IV poles or bedside tables.  Document Released: 03/26/2000 Document Revised: 03/15/2012 Document Reviewed: 12/05/2011  [employer] Patient Information 2014 [employer].

## 2013-05-14 NOTE — ED Notes (Signed)
[facility] [address]. Roll out of bed. Hx of Dementia but currently at her baseline. Oriented to person, place, situation. C/o chin, pain, rt side neck, Lower lip LAC due to bite. EMS VS BP 142/P, HR 92, RR 16

## 2013-05-14 NOTE — ED Provider Notes (Signed)
CSN: [id number]     Arrival date & time 05/14/13  0650 History   First MD Initiated Contact with Patient 05/14/13 [phone]     Chief Complaint  Patient presents with  . Fall   (Consider location/radiation/quality/duration/timing/severity/associated sxs/prior Treatment) HPI Comments: Patient is a 80 year old female with history of hypertension, dementia, stroke. She is brought here after a fall at nursing home. She apparently slipped out of bed and hit her head on the floor. She has abrasions to her lips and complains of headache and neck pain. She also states that her hips hurt, but tells me that this has been an ongoing problem.  Patient is a 80 y.o. female presenting with fall. The history is provided by the patient.  Fall This is a new problem. The current episode started less than 1 hour ago. The problem occurs constantly. The problem has not changed since onset.Associated symptoms include headaches. Pertinent negatives include no chest pain, no abdominal pain and no shortness of breath. Nothing aggravates the symptoms. Nothing relieves the symptoms. She has tried nothing for the symptoms. The treatment provided no relief.    Past Medical History  Diagnosis Date  . Hypertension   . Dementia   . Stroke   . Renal disorder   . Renal failure   . Hyperlipidemia    Past Surgical History  Procedure Laterality Date  . Femur im nail Right 01/25/2013    Procedure: INTRAMEDULLARY (IM) NAIL FEMORAL;  Surgeon: Sheral Apley, MD;  Location: [facility] OR;  Service: Orthopedics;  Laterality: Right;   No family history on file. History  Substance Use Topics  . Smoking status: Unknown If Ever Smoked  . Smokeless tobacco: Not on file  . Alcohol Use: Not on file   OB History   Grav Para Term Preterm Abortions TAB SAB Ect Mult Living                 Review of Systems  Respiratory: Negative for shortness of breath.   Cardiovascular: Negative for chest pain.  Gastrointestinal: Negative for abdominal  pain.  Neurological: Positive for headaches.  All other systems reviewed and are negative.    Allergies  Review of patient's allergies indicates no known allergies.  Home Medications   Current Outpatient Rx  Name  Route  Sig  Dispense  Refill  . acetaminophen (TYLENOL) 500 MG tablet   Oral   Take 500 mg by mouth every 6 (six) hours as needed for mild pain (severe pain).         Marland Kitchen amLODipine (NORVASC) 5 MG tablet   Oral   Take 5 mg by mouth daily.         Marland Kitchen aspirin EC 325 MG EC tablet   Oral   Take 1 tablet (325 mg total) by mouth daily with breakfast.   30 tablet   0   . cefpodoxime (VANTIN) 200 MG tablet   Oral   Take 1 tablet (200 mg total) by mouth 2 (two) times daily.   14 tablet   0   . clopidogrel (PLAVIX) 75 MG tablet   Oral   Take 75 mg by mouth daily.         . divalproex (DEPAKOTE SPRINKLE) 125 MG capsule   Oral   Take 125 mg by mouth at bedtime.         . docusate sodium 100 MG CAPS   Oral   Take 100 mg by mouth 2 (two) times daily.   10 capsule  0   . dorzolamide (TRUSOPT) 2 % ophthalmic solution   Both Eyes   Place 1 drop into both eyes 2 (two) times daily.         . feeding supplement, ENSURE COMPLETE, (ENSURE COMPLETE) LIQD   Oral   Take 237 mLs by mouth 2 (two) times daily between meals.   30 Bottle   0   . ferrous sulfate 325 (65 FE) MG tablet   Oral   Take 325 mg by mouth every morning.         Marland Kitchen. HYDROcodone-acetaminophen (NORCO) 5-325 MG per tablet   Oral   Take 2 tablets by mouth every 4 (four) hours as needed for pain.   60 tablet   0   . latanoprost (XALATAN) 0.005 % ophthalmic solution   Both Eyes   Place 1 drop into both eyes at bedtime.          Marland Kitchen. LORazepam (ATIVAN) 0.5 MG tablet   Oral   Take 0.5 mg by mouth 3 (three) times daily as needed for anxiety (agitation). Patient takes twice daily and sometimes 3 times daily if needed         . meclizine (ANTIVERT) 25 MG tablet   Oral   Take 25 mg by mouth  3 (three) times daily as needed for dizziness.         . metoprolol succinate (TOPROL-XL) 25 MG 24 hr tablet   Oral   Take 25 mg by mouth daily.         . mirtazapine (REMERON) 15 MG tablet   Oral   Take 15 mg by mouth at bedtime.         . Multiple Vitamin (MULTIVITAMIN) tablet   Oral   Take 1 tablet by mouth daily.         . polyethylene glycol (MIRALAX / GLYCOLAX) packet   Oral   Take 17 g by mouth daily.         . pravastatin (PRAVACHOL) 40 MG tablet   Oral   Take 40 mg by mouth daily.         Marland Kitchen. PRESCRIPTION MEDICATION   Transdermal   Place 1 mL onto the skin every 4 (four) hours as needed (agitation). Ativan 0.5mg /ml         . tamsulosin (FLOMAX) 0.4 MG CAPS capsule   Oral   Take 1 capsule (0.4 mg total) by mouth daily.   10 capsule   0    BP 138/59  Pulse 85  Temp(Src) 98.1 F (36.7 C) (Oral)  Resp 16  SpO2 99% Physical Exam  Nursing note and vitals reviewed. Constitutional: She is oriented to person, place, and time. She appears well-developed and well-nourished. No distress.  HENT:  Head: Normocephalic and atraumatic.  Mouth/Throat: Oropharynx is clear and moist.  There are superficial laceration/abrasions to the right upper and lower lip.  Eyes: EOM are normal. Pupils are equal, round, and reactive to light.  Neck:  Neck is immobilized in a cervical collar, however there is no bony tenderness and no step-offs palpable.  Cardiovascular: Normal rate, regular rhythm and normal heart sounds.   No murmur heard. Pulmonary/Chest: Effort normal and breath sounds normal. No respiratory distress. She has no wheezes.  Abdominal: Soft. Bowel sounds are normal.  Musculoskeletal: Normal range of motion. She exhibits no edema.  Bilateral hips appear grossly normal. There is no shortening or rotation of either leg. She has no discomfort with internal and external rotation of the  hips.  Neurological: She is alert and oriented to person, place, and time. No  cranial nerve deficit. She exhibits normal muscle tone. Coordination normal.  Skin: Skin is warm and dry. She is not diaphoretic.    ED Course  Procedures (including critical care time) Labs Review Labs Reviewed - No data to display Imaging Review No results found.    MDM  No diagnosis found. Patient is a 80 year old female brought from a nursing home after fall. CT of the head and cervical spine are unremarkable and x-ray of the pelvis reveals no fracture. She appears stable for discharge. She does have small abrasions to her lips which do not require any specific repair or treatment. To return as needed for any problems.    Geoffery Lyons, MD 05/14/13 [phone]

## 2013-06-10 ENCOUNTER — Emergency Department (HOSPITAL_COMMUNITY)
Admission: EM | Admit: 2013-06-10 | Discharge: 2013-06-10 | Disposition: A | Discharge status: Home / Self Care | Payer: PRIVATE HEALTH INSURANCE | Attending: Emergency Medicine | Admitting: Emergency Medicine

## 2013-06-10 ENCOUNTER — Encounter (HOSPITAL_COMMUNITY): Payer: Self-pay | Admitting: Emergency Medicine

## 2013-06-10 ENCOUNTER — Emergency Department (HOSPITAL_COMMUNITY): Payer: PRIVATE HEALTH INSURANCE

## 2013-06-10 DIAGNOSIS — Z8673 Personal history of transient ischemic attack (TIA), and cerebral infarction without residual deficits: Secondary | ICD-10-CM | POA: Insufficient documentation

## 2013-06-10 DIAGNOSIS — I10 Essential (primary) hypertension: Secondary | ICD-10-CM | POA: Insufficient documentation

## 2013-06-10 DIAGNOSIS — F29 Unspecified psychosis not due to a substance or known physiological condition: Secondary | ICD-10-CM | POA: Insufficient documentation

## 2013-06-10 DIAGNOSIS — Z7902 Long term (current) use of antithrombotics/antiplatelets: Secondary | ICD-10-CM | POA: Insufficient documentation

## 2013-06-10 DIAGNOSIS — E785 Hyperlipidemia, unspecified: Secondary | ICD-10-CM | POA: Insufficient documentation

## 2013-06-10 DIAGNOSIS — Z87448 Personal history of other diseases of urinary system: Secondary | ICD-10-CM | POA: Insufficient documentation

## 2013-06-10 DIAGNOSIS — Z7982 Long term (current) use of aspirin: Secondary | ICD-10-CM | POA: Insufficient documentation

## 2013-06-10 DIAGNOSIS — Z79899 Other long term (current) drug therapy: Secondary | ICD-10-CM | POA: Insufficient documentation

## 2013-06-10 DIAGNOSIS — F0390 Unspecified dementia without behavioral disturbance: Secondary | ICD-10-CM | POA: Insufficient documentation

## 2013-06-10 LAB — URINALYSIS, ROUTINE W REFLEX MICROSCOPIC
BILIRUBIN URINE: NEGATIVE
Glucose, UA: NEGATIVE mg/dL
HGB URINE DIPSTICK: NEGATIVE
KETONES UR: NEGATIVE mg/dL
Leukocytes, UA: NEGATIVE
Nitrite: NEGATIVE
PROTEIN: NEGATIVE mg/dL
Specific Gravity, Urine: 1.014 (ref 1.005–1.030)
UROBILINOGEN UA: 0.2 mg/dL (ref 0.0–1.0)
pH: 6 (ref 5.0–8.0)

## 2013-06-10 LAB — COMPREHENSIVE METABOLIC PANEL
ALBUMIN: 3.1 g/dL — AB (ref 3.5–5.2)
ALK PHOS: 89 U/L (ref 39–117)
ALT: 14 U/L (ref 0–35)
AST: 24 U/L (ref 0–37)
BILIRUBIN TOTAL: 0.4 mg/dL (ref 0.3–1.2)
BUN: 24 mg/dL — ABNORMAL HIGH (ref 6–23)
CHLORIDE: 105 meq/L (ref 96–112)
CO2: 27 mEq/L (ref 19–32)
CREATININE: 1.23 mg/dL — AB (ref 0.50–1.10)
Calcium: 9.3 mg/dL (ref 8.4–10.5)
GFR calc Af Amer: 43 mL/min — ABNORMAL LOW (ref >90)
GFR calc non Af Amer: 37 mL/min — ABNORMAL LOW (ref >90)
GLUCOSE: 103 mg/dL — AB (ref 70–99)
POTASSIUM: 4.5 meq/L (ref 3.7–5.3)
Sodium: 142 mEq/L (ref 137–147)
Total Protein: 7.1 g/dL (ref 6.0–8.3)

## 2013-06-10 LAB — CBC
HEMATOCRIT: 28.7 % — AB (ref 36.0–46.0)
HEMOGLOBIN: 9.9 g/dL — AB (ref 12.0–15.0)
MCH: 31.8 pg (ref 26.0–34.0)
MCHC: 34.5 g/dL (ref 30.0–36.0)
MCV: 92.3 fL (ref 78.0–100.0)
Platelets: 151 10*3/uL (ref 150–400)
RBC: 3.11 MIL/uL — AB (ref 3.87–5.11)
RDW: 13.8 % (ref 11.5–15.5)
WBC: 4.1 10*3/uL (ref 4.0–10.5)

## 2013-06-10 NOTE — ED Provider Notes (Signed)
CSN: [id number]     Arrival date & time 06/10/13  [id number] History   First MD Initiated Contact with Patient 06/10/13 [phone]     Chief Complaint  Patient presents with  . Altered Mental Status     Level V caveat: Dementia  HPI Patient was brought to the emergency department from her nursing facility for possibly increasing confusion as compared to baseline.  Nursing staff was concerned about the possibility of a urinary tract infection.  No reported recent vomiting or diarrhea.  Oral intake has been okay.  No other complaints and the nursing home.  Patient is pleasantly demented and has no complaints at this time but is unable to provide any reasonable history   Past Medical History  Diagnosis Date  . Hypertension   . Dementia   . Stroke   . Renal disorder   . Renal failure   . Hyperlipidemia    Past Surgical History  Procedure Laterality Date  . Femur im nail Right 01/25/2013    Procedure: INTRAMEDULLARY (IM) NAIL FEMORAL;  Surgeon: Sheral Apleyimothy D Murphy, MD;  Location: [facility] OR;  Service: Orthopedics;  Laterality: Right;   No family history on file. History  Substance Use Topics  . Smoking status: Unknown If Ever Smoked  . Smokeless tobacco: Not on file  . Alcohol Use: Not on file   OB History   Grav Para Term Preterm Abortions TAB SAB Ect Mult Living                 Review of Systems  All other systems reviewed and are negative.      Allergies  Review of patient's allergies indicates no known allergies.  Home Medications   Current Outpatient Rx  Name  Route  Sig  Dispense  Refill  . acetaminophen (TYLENOL) 500 MG tablet   Oral   Take 500-1,000 mg by mouth every 6 (six) hours as needed for moderate pain.         Marland Kitchen. amLODipine (NORVASC) 2.5 MG tablet   Oral   Take 2.5 mg by mouth daily.         Marland Kitchen. aspirin 325 MG tablet   Oral   Take 325 mg by mouth daily.         . clopidogrel (PLAVIX) 75 MG tablet   Oral   Take 75 mg by mouth daily.         Marland Kitchen. docusate  sodium 100 MG CAPS   Oral   Take 100 mg by mouth 2 (two) times daily.   10 capsule   0   . dorzolamide (TRUSOPT) 2 % ophthalmic solution   Both Eyes   Place 1 drop into both eyes 2 (two) times daily.         Marland Kitchen. HYDROcodone-acetaminophen (NORCO) 5-325 MG per tablet   Oral   Take 2 tablets by mouth every 4 (four) hours as needed for pain.   60 tablet   0   . latanoprost (XALATAN) 0.005 % ophthalmic solution   Both Eyes   Place 1 drop into both eyes at bedtime.          . meclizine (ANTIVERT) 25 MG tablet   Oral   Take 25 mg by mouth 3 (three) times daily as needed for dizziness.         . metoprolol succinate (TOPROL-XL) 25 MG 24 hr tablet   Oral   Take 25 mg by mouth daily.         .Marland Kitchen  mirtazapine (REMERON) 7.5 MG tablet   Oral   Take 7.5 mg by mouth at bedtime.         . polyethylene glycol (MIRALAX / GLYCOLAX) packet   Oral   Take 17 g by mouth daily as needed for moderate constipation.         . pravastatin (PRAVACHOL) 40 MG tablet   Oral   Take 40 mg by mouth daily.         Marland Kitchen PRESCRIPTION MEDICATION   Topical   Apply 0.5 mg topically every 4 (four) hours as needed (agitation).         . QUEtiapine (SEROQUEL) 50 MG tablet   Oral   Take 50 mg by mouth 2 (two) times daily.         . sertraline (ZOLOFT) 100 MG tablet   Oral   Take 100 mg by mouth daily.          . tamsulosin (FLOMAX) 0.4 MG CAPS capsule   Oral   Take 1 capsule (0.4 mg total) by mouth daily.   10 capsule   0    BP 133/61  Pulse 64  Temp(Src) 97.5 F (36.4 C) (Oral)  Resp 20  SpO2 99% Physical Exam  Nursing note and vitals reviewed. Constitutional: She appears well-developed and well-nourished. No distress.  HENT:  Head: Normocephalic and atraumatic.  Eyes: EOM are normal.  Neck: Normal range of motion.  Cardiovascular: Normal rate, regular rhythm and normal heart sounds.   Pulmonary/Chest: Effort normal and breath sounds normal.  Abdominal: Soft. She exhibits no  distension. There is no tenderness.  Genitourinary:  Normal external genitalia.  No rash  Musculoskeletal: Normal range of motion.  Neurological: She is alert.  Oriented to person but not place or time.  Moves all 4 extremities with good strength.  Skin: Skin is warm and dry. No rash noted. No erythema.    ED Course  Procedures (including critical care time) Labs Review Labs Reviewed  CBC - Abnormal; Notable for the following:    RBC 3.11 (*)    Hemoglobin 9.9 (*)    HCT 28.7 (*)    All other components within normal limits  COMPREHENSIVE METABOLIC PANEL - Abnormal; Notable for the following:    Glucose, Bld 103 (*)    BUN 24 (*)    Creatinine, Ser 1.23 (*)    Albumin 3.1 (*)    GFR calc non Af Amer 37 (*)    GFR calc Af Amer 43 (*)    All other components within normal limits  URINE CULTURE  URINALYSIS, ROUTINE W REFLEX MICROSCOPIC   Imaging Review No results found.   EKG Interpretation None      MDM   Final diagnoses:  Dementia    No clear etiology found.  Patient's been calm and cooperative here in the emergency department.  Discharge home with PCP followup and back to the nursing facility for ongoing monitoring.  No dictation for additional testing or admission at this time.    Lyanne Co, MD 06/10/13 [phone]

## 2013-06-10 NOTE — ED Notes (Signed)
Bed: [id number] Expected date: 06/10/13 Expected time: 9:44 AM Means of arrival: Ambulance Comments: AMS ? UTI

## 2013-06-10 NOTE — ED Notes (Signed)
[facility] called for pt transport home.  

## 2013-06-10 NOTE — ED Notes (Signed)
[facility] Per [facility], facility states that pt is more altered than normal and pt may have "possible UTI". Pt has hx of dementia, Alzheimer's and renal failure. Pt in NAD

## 2013-06-11 LAB — URINE CULTURE
Colony Count: NO GROWTH
Culture: NO GROWTH

## 2013-06-16 ENCOUNTER — Encounter (HOSPITAL_COMMUNITY): Payer: Self-pay | Admitting: Emergency Medicine

## 2013-06-16 ENCOUNTER — Emergency Department (HOSPITAL_COMMUNITY)
Admission: EM | Admit: 2013-06-16 | Discharge: 2013-06-16 | Disposition: A | Discharge status: Home / Self Care | Payer: PRIVATE HEALTH INSURANCE | Attending: Emergency Medicine | Admitting: Emergency Medicine

## 2013-06-16 ENCOUNTER — Emergency Department (HOSPITAL_COMMUNITY): Payer: PRIVATE HEALTH INSURANCE

## 2013-06-16 DIAGNOSIS — S0003XA Contusion of scalp, initial encounter: Secondary | ICD-10-CM | POA: Insufficient documentation

## 2013-06-16 DIAGNOSIS — Z7982 Long term (current) use of aspirin: Secondary | ICD-10-CM | POA: Insufficient documentation

## 2013-06-16 DIAGNOSIS — F0390 Unspecified dementia without behavioral disturbance: Secondary | ICD-10-CM | POA: Insufficient documentation

## 2013-06-16 DIAGNOSIS — Z8673 Personal history of transient ischemic attack (TIA), and cerebral infarction without residual deficits: Secondary | ICD-10-CM | POA: Insufficient documentation

## 2013-06-16 DIAGNOSIS — W19XXXA Unspecified fall, initial encounter: Secondary | ICD-10-CM

## 2013-06-16 DIAGNOSIS — Y929 Unspecified place or not applicable: Secondary | ICD-10-CM | POA: Insufficient documentation

## 2013-06-16 DIAGNOSIS — W050XXA Fall from non-moving wheelchair, initial encounter: Secondary | ICD-10-CM | POA: Insufficient documentation

## 2013-06-16 DIAGNOSIS — Y939 Activity, unspecified: Secondary | ICD-10-CM | POA: Insufficient documentation

## 2013-06-16 DIAGNOSIS — Z79899 Other long term (current) drug therapy: Secondary | ICD-10-CM | POA: Insufficient documentation

## 2013-06-16 DIAGNOSIS — Z7902 Long term (current) use of antithrombotics/antiplatelets: Secondary | ICD-10-CM | POA: Insufficient documentation

## 2013-06-16 DIAGNOSIS — I10 Essential (primary) hypertension: Secondary | ICD-10-CM | POA: Insufficient documentation

## 2013-06-16 DIAGNOSIS — S1093XA Contusion of unspecified part of neck, initial encounter: Principal | ICD-10-CM

## 2013-06-16 DIAGNOSIS — S0083XA Contusion of other part of head, initial encounter: Secondary | ICD-10-CM

## 2013-06-16 DIAGNOSIS — Z87448 Personal history of other diseases of urinary system: Secondary | ICD-10-CM | POA: Insufficient documentation

## 2013-06-16 DIAGNOSIS — E785 Hyperlipidemia, unspecified: Secondary | ICD-10-CM | POA: Insufficient documentation

## 2013-06-16 LAB — URINALYSIS, ROUTINE W REFLEX MICROSCOPIC
Bilirubin Urine: NEGATIVE
GLUCOSE, UA: NEGATIVE mg/dL
HGB URINE DIPSTICK: NEGATIVE
KETONES UR: NEGATIVE mg/dL
Leukocytes, UA: NEGATIVE
Nitrite: NEGATIVE
Protein, ur: NEGATIVE mg/dL
Specific Gravity, Urine: 1.013 (ref 1.005–1.030)
Urobilinogen, UA: 1 mg/dL (ref 0.0–1.0)
pH: 5.5 (ref 5.0–8.0)

## 2013-06-16 NOTE — ED Notes (Addendum)
She remains drowsy and in no distress.  We are awaiting the arrival of [facility], whom we notified almost an hour ago.

## 2013-06-16 NOTE — ED Notes (Signed)
[facility]; the staff there reporting that pt. Larey SeatFell forward from her w/c striking her upper forehead area.  Pt. Is sleepy and in no distress.  She moves all extremities spontaneously and PEARL at 4mm.

## 2013-06-16 NOTE — ED Provider Notes (Signed)
I saw and evaluated the patient, reviewed the resident's note and I agree with the findings and plan.   EKG Interpretation None      Pt with mechanical sounding fall out of wheelchair.  Small hematoma to forehead.  No palpable tenderness along spine.  No pain on ROM of hips or other extremities.  Per staff, at baseline mental status. Check head/cervical CT.  Rolan BuccoMelanie Kinzlie Harney, MD 06/16/13 [phone]

## 2013-06-16 NOTE — ED Provider Notes (Signed)
CSN: [id number]     Arrival date & time 06/16/13  0901 History   First MD Initiated Contact with Patient 06/16/13 [phone]     Chief Complaint  Patient presents with  . Fall  . Head Injury   The history is provided by the nursing home and the patient. The history is limited by the absence of a caregiver.   Jacqueline Mccann is 80 y.o. AA elderly female presented to the ED after a fall from her WC this morning.  Patient has had multiple falls, this her 4th fall  in 5 months. Patient is pleasantly demented and unable to provide history. Per nursing home, patient was found down due to her wheelchair alarming. Staff reports fall was un witnessed fall this morning around changes of shift. Patient has been her normal self, no increased confusion, illness or fevers. Patient is on ASA and plavix. Patient has decreased PO at baseline.   Past Medical History  Diagnosis Date  . Hypertension   . Dementia   . Stroke   . Renal disorder   . Renal failure   . Hyperlipidemia    Past Surgical History  Procedure Laterality Date  . Femur im nail Right 01/25/2013    Procedure: INTRAMEDULLARY (IM) NAIL FEMORAL;  Surgeon: Sheral Apleyimothy D Murphy, MD;  Location: [facility] OR;  Service: Orthopedics;  Laterality: Right;   No family history on file. History  Substance Use Topics  . Smoking status: Unknown If Ever Smoked  . Smokeless tobacco: Not on file  . Alcohol Use: Not on file   OB History   Grav Para Term Preterm Abortions TAB SAB Ect Mult Living                 Review of Systems Caveat V, unable to obtain ROS d/t to dementia.   Allergies  Review of patient's allergies indicates no known allergies.  Home Medications   Current Outpatient Rx  Name  Route  Sig  Dispense  Refill  . amLODipine (NORVASC) 2.5 MG tablet   Oral   Take 2.5 mg by mouth daily.         Marland Kitchen. aspirin 325 MG tablet   Oral   Take 325 mg by mouth daily.         . clopidogrel (PLAVIX) 75 MG tablet   Oral   Take 75 mg by mouth  daily.         Marland Kitchen. docusate sodium 100 MG CAPS   Oral   Take 100 mg by mouth 2 (two) times daily.   10 capsule   0   . dorzolamide (TRUSOPT) 2 % ophthalmic solution   Both Eyes   Place 1 drop into both eyes 2 (two) times daily.         Marland Kitchen. HYDROcodone-acetaminophen (NORCO/VICODIN) 5-325 MG per tablet   Oral   Take 1 tablet by mouth every 4 (four) hours as needed for moderate pain.         Marland Kitchen. latanoprost (XALATAN) 0.005 % ophthalmic solution   Both Eyes   Place 1 drop into both eyes at bedtime.          Marland Kitchen. LORazepam (ATIVAN) 1 MG tablet   Oral   Take 1 mg by mouth every 6 (six) hours as needed for anxiety.         . metoprolol succinate (TOPROL-XL) 25 MG 24 hr tablet   Oral   Take 25 mg by mouth daily.         .Marland Kitchen  mirtazapine (REMERON) 7.5 MG tablet   Oral   Take 7.5 mg by mouth at bedtime.         Marland Kitchen NUTRITIONAL SUPPLEMENT LIQD   Oral   Take 120 mLs by mouth 2 (two) times daily. [employer]         . pravastatin (PRAVACHOL) 40 MG tablet   Oral   Take 40 mg by mouth daily.         . QUEtiapine (SEROQUEL) 50 MG tablet   Oral   Take 50 mg by mouth 2 (two) times daily.         . sertraline (ZOLOFT) 100 MG tablet   Oral   Take 100 mg by mouth daily.          . tamsulosin (FLOMAX) 0.4 MG CAPS capsule   Oral   Take 1 capsule (0.4 mg total) by mouth daily.   10 capsule   0   . acetaminophen (TYLENOL) 500 MG tablet   Oral   Take 500-1,000 mg by mouth every 6 (six) hours as needed for moderate pain.         . meclizine (ANTIVERT) 25 MG tablet   Oral   Take 25 mg by mouth 3 (three) times daily as needed for dizziness.         . polyethylene glycol (MIRALAX / GLYCOLAX) packet   Oral   Take 17 g by mouth daily as needed for moderate constipation.         Marland Kitchen PRESCRIPTION MEDICATION   Topical   Apply 0.5 mg topically every 4 (four) hours as needed (agitation). Lorazepam 0.5mg /44ml gel          BP 129/77  Pulse 87  Temp(Src) 98 F (36.7 C)  (Oral)  Resp 16  SpO2 95% Physical Exam Gen: NAD. Elderly thin female. Sitting up, eyes closed.  HEENT: [place]. Right ~2cm hematoma right superior forehead at hairline.  Bilateral TM visualized and normal in appearance. Bilateral eyes without injections. Dry mucous membranes. CV: RRR, No murmur Chest: CTAB, no wheeze or crackles. Diminished breath sounds.  Abd: Soft.  NTND. BS present. No Masses palpated.  Ext: No erythema. Trace edema.  Skin: No rashes, purpura or petechiae.  Neuro:  PERLA. Alert. Grossly intact. Oriented to person only. EOM difficult to access. She is keeping her eyes shut.  MSK: . Moves all four extremities.No tenderness over cervical spine or greater trochanter of bilateral hips.  Psych: Demented. Follows some commands. Answers some questions appropriately, but no all.   ED Course  Procedures (including critical care time) Labs Review Labs Reviewed  URINALYSIS, ROUTINE W REFLEX MICROSCOPIC - Abnormal; Notable for the following:    APPearance CLOUDY (*)    All other components within normal limits   Imaging Review Ct Head Wo Contrast  06/16/2013   CLINICAL DATA:  Status post fall.  EXAM: CT HEAD WITHOUT CONTRAST  CT CERVICAL SPINE WITHOUT CONTRAST  TECHNIQUE: Multidetector CT imaging of the head and cervical spine was performed following the standard protocol without intravenous contrast. Multiplanar CT image reconstructions of the cervical spine were also generated.  COMPARISON:  05/14/2013  FINDINGS: CT HEAD FINDINGS  There is low attenuation within the subcortical and periventricular white matter compatible with small vessel ischemic disease. Right frontal and right occipital encephalomalacia is identified compatible with previous infarcts. There is prominence of the sulci and ventricles compatible with brain atrophy. No acute cortical infarct, hemorrhage, or mass lesion ispresent. The paranasal sinuses and mastoid air  cells are clear. The skull is intact. There is a small  right frontal scalp hematoma, image 31/series 4.  CT CERVICAL SPINE FINDINGS  Straightening of normal cervical lordosis identified. The vertebral body heights are well preserved. The facet joints are aligned. There is moderate to advanced multi level disc space narrowing and ventral endplate spurring identified. No evidence for cervical spine fracture.  IMPRESSION: CT head:  1. No acute intracranial abnormalities. 2. Small vessel ischemic disease. 3. 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 Item number for right frontal scalp hematoma. CT cervical spine:  1. No evidence for cervical spine fracture. 2. Cervical spondylosis noted.   Electronically Signed   By: Signa Kell M.D.   On: 06/16/2013 10:47   Ct Cervical Spine Wo Contrast  06/16/2013   CLINICAL DATA:  Status post fall.  EXAM: CT HEAD WITHOUT CONTRAST  CT CERVICAL SPINE WITHOUT CONTRAST  TECHNIQUE: Multidetector CT imaging of the head and cervical spine was performed following the standard protocol without intravenous contrast. Multiplanar CT image reconstructions of the cervical spine were also generated.  COMPARISON:  05/14/2013  FINDINGS: CT HEAD FINDINGS  There is low attenuation within the subcortical and periventricular white matter compatible with small vessel ischemic disease. Right frontal and right occipital encephalomalacia is identified compatible with previous infarcts. There is prominence of the sulci and ventricles compatible with brain atrophy. No acute cortical infarct, hemorrhage, or mass lesion ispresent. The paranasal sinuses and mastoid air cells are clear. The skull is intact. There is a small right frontal scalp hematoma, image 31/series 4.  CT CERVICAL SPINE FINDINGS  Straightening of normal cervical lordosis identified. The vertebral body heights are well preserved. The facet joints are aligned. There is moderate to advanced multi level disc space narrowing and ventral endplate spurring identified. No evidence for  cervical spine fracture.  IMPRESSION: CT head:  1. No acute intracranial abnormalities. 2. Small vessel ischemic disease. 3. 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 Item number for right frontal scalp hematoma. CT cervical spine:  1. No evidence for cervical spine fracture. 2. Cervical spondylosis noted.   Electronically Signed   By: Signa Kell M.D.   On: 06/16/2013 10:47     EKG Interpretation None      MDM   Final diagnoses:  Fall  Traumatic hematoma of forehead   Patient brought by EMS for evaluation  after a fall out of her wheelchair this morning. Patient is demented and unable to provide information and history. [facility] was contacted to gain additional information, that basically stated it was an un-wittnessed fall and that she has been within her normal baseline. CT of head and cervical spine was negative for acute process. Patient sustained a small hematoma to her forehead with no laceration. UA was obtained and normal, no signs of infection. Vitals remained stable. Patient was transferred back to [facility] facility with recommendations to follow up within a week with PCP.     Natalia Leatherwood, DO 06/16/13 1221  Gaddiel Cullens A Preeti Winegardner, DO 06/16/13 1222

## 2013-06-16 NOTE — ED Notes (Signed)
Bed: [id number] Expected date: 06/16/13 Expected time: 8:59 AM Means of arrival: Ambulance Comments: Larey SeatFell out of WC hematoma to head

## 2013-06-16 NOTE — Discharge Instructions (Signed)

## 2013-06-16 NOTE — ED Notes (Signed)
She is awake and calm and is taken to [facility] As I write this.  I have just spoken with her daughter on the phone and she thanks us for our care.

## 2013-06-17 ENCOUNTER — Emergency Department (HOSPITAL_COMMUNITY)
Admission: EM | Admit: 2013-06-17 | Discharge: 2013-06-17 | Disposition: A | Discharge status: Home / Self Care | Payer: PRIVATE HEALTH INSURANCE | Attending: Emergency Medicine | Admitting: Emergency Medicine

## 2013-06-17 ENCOUNTER — Encounter (HOSPITAL_COMMUNITY): Payer: Self-pay | Admitting: Emergency Medicine

## 2013-06-17 DIAGNOSIS — G309 Alzheimer's disease, unspecified: Secondary | ICD-10-CM | POA: Insufficient documentation

## 2013-06-17 DIAGNOSIS — Z7902 Long term (current) use of antithrombotics/antiplatelets: Secondary | ICD-10-CM | POA: Insufficient documentation

## 2013-06-17 DIAGNOSIS — Z7982 Long term (current) use of aspirin: Secondary | ICD-10-CM | POA: Insufficient documentation

## 2013-06-17 DIAGNOSIS — Z8673 Personal history of transient ischemic attack (TIA), and cerebral infarction without residual deficits: Secondary | ICD-10-CM | POA: Insufficient documentation

## 2013-06-17 DIAGNOSIS — I10 Essential (primary) hypertension: Secondary | ICD-10-CM | POA: Insufficient documentation

## 2013-06-17 DIAGNOSIS — S0083XA Contusion of other part of head, initial encounter: Secondary | ICD-10-CM

## 2013-06-17 DIAGNOSIS — Z87448 Personal history of other diseases of urinary system: Secondary | ICD-10-CM | POA: Insufficient documentation

## 2013-06-17 DIAGNOSIS — Y9210 Unspecified residential institution as the place of occurrence of the external cause: Secondary | ICD-10-CM | POA: Insufficient documentation

## 2013-06-17 DIAGNOSIS — Y9389 Activity, other specified: Secondary | ICD-10-CM | POA: Insufficient documentation

## 2013-06-17 DIAGNOSIS — W1809XA Striking against other object with subsequent fall, initial encounter: Secondary | ICD-10-CM | POA: Insufficient documentation

## 2013-06-17 DIAGNOSIS — S1093XA Contusion of unspecified part of neck, initial encounter: Principal | ICD-10-CM

## 2013-06-17 DIAGNOSIS — W010XXA Fall on same level from slipping, tripping and stumbling without subsequent striking against object, initial encounter: Secondary | ICD-10-CM | POA: Insufficient documentation

## 2013-06-17 DIAGNOSIS — S0003XA Contusion of scalp, initial encounter: Secondary | ICD-10-CM | POA: Insufficient documentation

## 2013-06-17 DIAGNOSIS — E785 Hyperlipidemia, unspecified: Secondary | ICD-10-CM | POA: Insufficient documentation

## 2013-06-17 DIAGNOSIS — Z79899 Other long term (current) drug therapy: Secondary | ICD-10-CM | POA: Insufficient documentation

## 2013-06-17 DIAGNOSIS — W050XXA Fall from non-moving wheelchair, initial encounter: Secondary | ICD-10-CM | POA: Insufficient documentation

## 2013-06-17 DIAGNOSIS — F0390 Unspecified dementia without behavioral disturbance: Secondary | ICD-10-CM

## 2013-06-17 DIAGNOSIS — F0280 Dementia in other diseases classified elsewhere without behavioral disturbance: Secondary | ICD-10-CM | POA: Insufficient documentation

## 2013-06-17 NOTE — ED Provider Notes (Addendum)
CSN: [id number]     Arrival date & time 06/17/13  1240 History   First MD Initiated Contact with Patient 06/17/13 1248     Chief Complaint  Patient presents with  . Fall     (Consider location/radiation/quality/duration/timing/severity/associated sxs/prior Treatment) Patient is a 80 y.o. female presenting with fall. The history is provided by the patient and the EMS personnel. The history is limited by the condition of the patient.  Fall Pertinent negatives include no chest pain, no abdominal pain, no headaches and no shortness of breath.  pt s/p fall at [facility]. Hx dementia. Was in wheelchair at nurses station, fell asleep, fell forward out of chair. Hit forehead on scale. No loc. pts mental status has remained at baseline since fall. No nv. Pt not on coumadin or similar anticoag. Pt denies headache. No neck or back pain. No numbness/weakness. Pt states she feels fine, denies c/o.     Past Medical History  Diagnosis Date  . Hypertension   . Dementia   . Stroke   . Renal disorder   . Renal failure   . Hyperlipidemia    Past Surgical History  Procedure Laterality Date  . Femur im nail Right 01/25/2013    Procedure: INTRAMEDULLARY (IM) NAIL FEMORAL;  Surgeon: Sheral Apley, MD;  Location: [facility] OR;  Service: Orthopedics;  Laterality: Right;   No family history on file. History  Substance Use Topics  . Smoking status: Unknown If Ever Smoked  . Smokeless tobacco: Not on file  . Alcohol Use: Not on file   OB History   Grav Para Term Preterm Abortions TAB SAB Ect Mult Living                 Review of Systems  Constitutional: Negative for fever.  HENT: Negative for nosebleeds.   Eyes: Negative for pain.  Respiratory: Negative for shortness of breath.   Cardiovascular: Negative for chest pain.  Gastrointestinal: Negative for nausea, vomiting and abdominal pain.  Genitourinary: Negative for flank pain.  Musculoskeletal: Negative for back pain and neck pain.  Skin: Negative for  rash.  Neurological: Negative for weakness, numbness and headaches.  Hematological: Does not bruise/bleed easily.  Psychiatric/Behavioral: Negative for agitation.      Allergies  Review of patient's allergies indicates no known allergies.  Home Medications   Current Outpatient Rx  Name  Route  Sig  Dispense  Refill  . acetaminophen (TYLENOL) 500 MG tablet   Oral   Take 500-1,000 mg by mouth every 6 (six) hours as needed for moderate pain.         Marland Kitchen amLODipine (NORVASC) 2.5 MG tablet   Oral   Take 2.5 mg by mouth daily.         Marland Kitchen aspirin 325 MG tablet   Oral   Take 325 mg by mouth daily.         . clopidogrel (PLAVIX) 75 MG tablet   Oral   Take 75 mg by mouth daily.         Marland Kitchen docusate sodium 100 MG CAPS   Oral   Take 100 mg by mouth 2 (two) times daily.   10 capsule   0   . dorzolamide (TRUSOPT) 2 % ophthalmic solution   Both Eyes   Place 1 drop into both eyes 2 (two) times daily.         Marland Kitchen HYDROcodone-acetaminophen (NORCO/VICODIN) 5-325 MG per tablet   Oral   Take 1 tablet by mouth every 4 (four)  hours as needed for moderate pain.         Marland Kitchen. latanoprost (XALATAN) 0.005 % ophthalmic solution   Both Eyes   Place 1 drop into both eyes at bedtime.          Marland Kitchen. LORazepam (ATIVAN) 1 MG tablet   Oral   Take 1 mg by mouth every 6 (six) hours as needed for anxiety.         . meclizine (ANTIVERT) 25 MG tablet   Oral   Take 25 mg by mouth 3 (three) times daily as needed for dizziness.         . metoprolol succinate (TOPROL-XL) 25 MG 24 hr tablet   Oral   Take 25 mg by mouth daily.         . mirtazapine (REMERON) 7.5 MG tablet   Oral   Take 7.5 mg by mouth at bedtime.         Marland Kitchen. NUTRITIONAL SUPPLEMENT LIQD   Oral   Take 120 mLs by mouth 2 (two) times daily. Med Pass         . polyethylene glycol (MIRALAX / GLYCOLAX) packet   Oral   Take 17 g by mouth daily as needed for moderate constipation.         . pravastatin (PRAVACHOL) 40 MG  tablet   Oral   Take 40 mg by mouth daily.         Marland Kitchen. PRESCRIPTION MEDICATION   Topical   Apply 0.5 mg topically every 4 (four) hours as needed (agitation). Lorazepam 0.5mg /51ml gel         . QUEtiapine (SEROQUEL) 50 MG tablet   Oral   Take 50 mg by mouth 2 (two) times daily.         . sertraline (ZOLOFT) 100 MG tablet   Oral   Take 100 mg by mouth daily.          . tamsulosin (FLOMAX) 0.4 MG CAPS capsule   Oral   Take 1 capsule (0.4 mg total) by mouth daily.   10 capsule   0    BP 153/65  Pulse 63  Temp(Src) 98.3 F (36.8 C) (Oral)  Resp 20  SpO2 99% Physical Exam  Nursing note and vitals reviewed. Constitutional: She appears well-developed and well-nourished. No distress.  HENT:  Minimal contusion to mid forehead.  Facial bones/orbits intact.   Eyes: Conjunctivae and EOM are normal. Pupils are equal, round, and reactive to light. No scleral icterus.  Neck: Normal range of motion. Neck supple. No tracheal deviation present.  Cardiovascular: Normal rate, normal heart sounds and intact distal pulses.   Pulmonary/Chest: Effort normal. No respiratory distress. She exhibits no tenderness.  Abdominal: Soft. Normal appearance. She exhibits no distension. There is no tenderness.  Musculoskeletal: She exhibits no edema and no tenderness.  Good rom bil extremities without pain or focal bony tenderness.   Neurological: She is alert.  Awake and alert. Responds to questions. Mental status noted to be c/w baseline. Moves bil ext purposefully, follows commands.   Skin: Skin is warm and dry. No rash noted.  Psychiatric: She has a normal mood and affect.    ED Course  Procedures (including critical care time)  Ct Head Wo Contrast  06/16/2013   CLINICAL DATA:  Status post fall.  EXAM: CT HEAD WITHOUT CONTRAST  CT CERVICAL SPINE WITHOUT CONTRAST  TECHNIQUE: Multidetector CT imaging of the head and cervical spine was performed following the standard protocol without intravenous  contrast.  Multiplanar CT image reconstructions of the cervical spine were also generated.  COMPARISON:  05/14/2013  FINDINGS: CT HEAD FINDINGS  There is low attenuation within the subcortical and periventricular white matter compatible with small vessel ischemic disease. Right frontal and right occipital encephalomalacia is identified compatible with previous infarcts. There is prominence of the sulci and ventricles compatible with brain atrophy. No acute cortical infarct, hemorrhage, or mass lesion ispresent. The paranasal sinuses and mastoid air cells are clear. The skull is intact. There is a small right frontal scalp hematoma, image 31/series 4.  CT CERVICAL SPINE FINDINGS  Straightening of normal cervical lordosis identified. The vertebral body heights are well preserved. The facet joints are aligned. There is moderate to advanced multi level disc space narrowing and ventral endplate spurring identified. No evidence for cervical spine fracture.  IMPRESSION: CT head:  1. No acute intracranial abnormalities. 2. Small vessel ischemic disease. 3. 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 Item number for right frontal scalp hematoma. CT cervical spine:  1. No evidence for cervical spine fracture. 2. Cervical spondylosis noted.   Electronically Signed   By: Signa Kell M.D.   On: 06/16/2013 10:47   Ct Cervical Spine Wo Contrast  06/16/2013   CLINICAL DATA:  Status post fall.  EXAM: CT HEAD WITHOUT CONTRAST  CT CERVICAL SPINE WITHOUT CONTRAST  TECHNIQUE: Multidetector CT imaging of the head and cervical spine was performed following the standard protocol without intravenous contrast. Multiplanar CT image reconstructions of the cervical spine were also generated.  COMPARISON:  05/14/2013  FINDINGS: CT HEAD FINDINGS  There is low attenuation within the subcortical and periventricular white matter compatible with small vessel ischemic disease. Right frontal and right occipital encephalomalacia is  identified compatible with previous infarcts. There is prominence of the sulci and ventricles compatible with brain atrophy. No acute cortical infarct, hemorrhage, or mass lesion ispresent. The paranasal sinuses and mastoid air cells are clear. The skull is intact. There is a small right frontal scalp hematoma, image 31/series 4.  CT CERVICAL SPINE FINDINGS  Straightening of normal cervical lordosis identified. The vertebral body heights are well preserved. The facet joints are aligned. There is moderate to advanced multi level disc space narrowing and ventral endplate spurring identified. No evidence for cervical spine fracture.  IMPRESSION: CT head:  1. No acute intracranial abnormalities. 2. Small vessel ischemic disease. 3. 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 Item number for right frontal scalp hematoma. CT cervical spine:  1. No evidence for cervical spine fracture. 2. Cervical spondylosis noted.   Electronically Signed   By: Signa Kell M.D.   On: 06/16/2013 10:47    MDM  Reviewed nursing notes and prior charts for additional history.   Pt w no loc. Mental status has remained at baseline.  No headache, no nv. Spine nt.  Pt appears stable for d/c.  Pt remains asymptomatic, w no c/o pain.     Suzi Roots, MD 06/17/13 [phone]

## 2013-06-17 NOTE — ED Notes (Signed)
Pt from [facility] via EMS- per EMS pt was sitting in wheel chair at nurses station, fell asleep, falling to floor. Pt hit head on the scale. Pt has small abrasion to L eyebrow. Fall was witnessed and pt did not have LOC. Pt has hx or dementia/alzheimers and is poor historian. Pt in NAD

## 2013-06-17 NOTE — Discharge Instructions (Signed)
Fall precautions - please take steps to minimize patients risk of fall. Return to ER if worse, new symptoms, vomiting, severe pain, change in mental status, other concern.    Fall Prevention and Home Safety Falls cause injuries and can affect all age groups. It is possible to use preventive measures to significantly decrease the likelihood of falls. There are many simple measures which can make your home safer and prevent falls. OUTDOORS  Repair cracks and edges of walkways and driveways.  Remove high doorway thresholds.  Trim shrubbery on the main path into your home.  Have good outside lighting.  Clear walkways of tools, rocks, debris, and clutter.  Check that handrails are not broken and are securely fastened. Both sides of steps should have handrails.  Have leaves, snow, and ice cleared regularly.  Use sand or salt on walkways during winter months.  In the garage, clean up grease or oil spills. BATHROOM  Install night lights.  Install grab bars by the toilet and in the tub and shower.  Use non-skid mats or decals in the tub or shower.  Place a plastic non-slip stool in the shower to sit on, if needed.  Keep floors dry and clean up all water on the floor immediately.  Remove soap buildup in the tub or shower on a regular basis.  Secure bath mats with non-slip, double-sided rug tape.  Remove throw rugs and tripping hazards from the floors. BEDROOMS  Install night lights.  Make sure a bedside light is easy to reach.  Do not use oversized bedding.  Keep a telephone by your bedside.  Have a firm chair with side arms to use for getting dressed.  Remove throw rugs and tripping hazards from the floor. KITCHEN  Keep handles on pots and pans turned toward the center of the stove. Use back burners when possible.  Clean up spills quickly and allow time for drying.  Avoid walking on wet floors.  Avoid hot utensils and knives.  Position shelves so they are not  too high or low.  Place commonly used objects within easy reach.  If necessary, use a sturdy step stool with a grab bar when reaching.  Keep electrical cables out of the way.  Do not use floor polish or wax that makes floors slippery. If you must use wax, use non-skid floor wax.  Remove throw rugs and tripping hazards from the floor. STAIRWAYS  Never leave objects on stairs.  Place handrails on both sides of stairways and use them. Fix any loose handrails. Make sure handrails on both sides of the stairways are as long as the stairs.  Check carpeting to make sure it is firmly attached along stairs. Make repairs to worn or loose carpet promptly.  Avoid placing throw rugs at the top or bottom of stairways, or properly secure the rug with carpet tape to prevent slippage. Get rid of throw rugs, if possible.  Have an electrician put in a light switch at the top and bottom of the stairs. OTHER FALL PREVENTION TIPS  Wear low-heel or rubber-soled shoes that are supportive and fit well. Wear closed toe shoes.  When using a stepladder, make sure it is fully opened and both spreaders are firmly locked. Do not climb a closed stepladder.  Add color or contrast paint or tape to grab bars and handrails in your home. Place contrasting color strips on first and last steps.  Learn and use mobility aids as needed. Install an electrical emergency response system.  Turn  on lights to avoid dark areas. Replace light bulbs that burn out immediately. Get light switches that glow.  Arrange furniture to create clear pathways. Keep furniture in the same place.  Firmly attach carpet with non-skid or double-sided tape.  Eliminate uneven floor surfaces.  Select a carpet pattern that does not visually hide the edge of steps.  Be aware of all pets. OTHER HOME SAFETY TIPS  Set the water temperature for 120 F (48.8 C).  Keep emergency numbers on or near the telephone.  Keep smoke detectors on every  level of the home and near sleeping areas. Document Released: 03/19/2002 Document Revised: 09/28/2011 Document Reviewed: 06/18/2011 [facility] Patient Information 2014 [place], [place].   Facial or Scalp Contusion A facial or scalp contusion is a deep bruise on the face or head. Injuries to the face and head generally cause a lot of swelling, especially around the eyes. Contusions are the result of an injury that caused bleeding under the skin. The contusion may turn blue, purple, or yellow. Minor injuries will give you a painless contusion, but more severe contusions may stay painful and swollen for a few weeks.  CAUSES  A facial or scalp contusion is caused by a blunt injury or trauma to the face or head area.  SIGNS AND SYMPTOMS   Swelling of the injured area.   Discoloration of the injured area.   Tenderness, soreness, or pain in the injured area.  DIAGNOSIS  The diagnosis can be made by taking a medical history and doing a physical exam. An X-ray exam, CT scan, or MRI may be needed to determine if there are any associated injuries, such as broken bones (fractures). TREATMENT  Often, the best treatment for a facial or scalp contusion is applying cold compresses to the injured area. Over-the-counter medicines may also be recommended for pain control.  HOME CARE INSTRUCTIONS   Only take over-the-counter or prescription medicines as directed by your health care provider.   Apply ice to the injured area.   Put ice in a plastic bag.   Place a towel between your skin and the bag.   Leave the ice on for 20 minutes, 2 3 times a day.  SEEK MEDICAL CARE IF:  You have bite problems.   You have pain with chewing.   You are concerned about facial defects. SEEK IMMEDIATE MEDICAL CARE IF:  You have severe pain or a headache that is not relieved by medicine.   You have unusual sleepiness, confusion, or personality changes.   You throw up (vomit).   You have a persistent  nosebleed.   You have double vision or blurred vision.   You have fluid drainage from your nose or ear.   You have difficulty walking or using your arms or legs.  MAKE SURE YOU:   Understand these instructions.  Will watch your condition.  Will get help right away if you are not doing well or get worse. Document Released: 05/06/2004 Document Revised: 01/17/2013 Document Reviewed: 11/09/2012 [facility] Patient Information 2014 [place], [place].

## 2013-06-17 NOTE — ED Notes (Signed)
Bed: WA09 Expected date:  Expected time:  Means of arrival:  [facility]

## 2013-06-19 ENCOUNTER — Emergency Department (HOSPITAL_COMMUNITY): Payer: PRIVATE HEALTH INSURANCE

## 2013-06-19 ENCOUNTER — Encounter (HOSPITAL_COMMUNITY): Payer: Self-pay | Admitting: Emergency Medicine

## 2013-06-19 ENCOUNTER — Emergency Department (HOSPITAL_COMMUNITY)
Admission: EM | Admit: 2013-06-19 | Discharge: 2013-06-19 | Disposition: A | Discharge status: Home / Self Care | Payer: PRIVATE HEALTH INSURANCE | Attending: Emergency Medicine | Admitting: Emergency Medicine

## 2013-06-19 DIAGNOSIS — W1809XA Striking against other object with subsequent fall, initial encounter: Secondary | ICD-10-CM | POA: Insufficient documentation

## 2013-06-19 DIAGNOSIS — F0390 Unspecified dementia without behavioral disturbance: Secondary | ICD-10-CM | POA: Insufficient documentation

## 2013-06-19 DIAGNOSIS — W19XXXA Unspecified fall, initial encounter: Secondary | ICD-10-CM

## 2013-06-19 DIAGNOSIS — Z7902 Long term (current) use of antithrombotics/antiplatelets: Secondary | ICD-10-CM | POA: Insufficient documentation

## 2013-06-19 DIAGNOSIS — S1093XA Contusion of unspecified part of neck, initial encounter: Principal | ICD-10-CM

## 2013-06-19 DIAGNOSIS — Z87448 Personal history of other diseases of urinary system: Secondary | ICD-10-CM | POA: Insufficient documentation

## 2013-06-19 DIAGNOSIS — Y9389 Activity, other specified: Secondary | ICD-10-CM | POA: Insufficient documentation

## 2013-06-19 DIAGNOSIS — S0003XA Contusion of scalp, initial encounter: Secondary | ICD-10-CM | POA: Insufficient documentation

## 2013-06-19 DIAGNOSIS — E785 Hyperlipidemia, unspecified: Secondary | ICD-10-CM | POA: Insufficient documentation

## 2013-06-19 DIAGNOSIS — Z8673 Personal history of transient ischemic attack (TIA), and cerebral infarction without residual deficits: Secondary | ICD-10-CM | POA: Insufficient documentation

## 2013-06-19 DIAGNOSIS — Y9210 Unspecified residential institution as the place of occurrence of the external cause: Secondary | ICD-10-CM | POA: Insufficient documentation

## 2013-06-19 DIAGNOSIS — Z7982 Long term (current) use of aspirin: Secondary | ICD-10-CM | POA: Insufficient documentation

## 2013-06-19 DIAGNOSIS — Z79899 Other long term (current) drug therapy: Secondary | ICD-10-CM | POA: Insufficient documentation

## 2013-06-19 DIAGNOSIS — S0083XA Contusion of other part of head, initial encounter: Secondary | ICD-10-CM

## 2013-06-19 DIAGNOSIS — I10 Essential (primary) hypertension: Secondary | ICD-10-CM | POA: Insufficient documentation

## 2013-06-19 DIAGNOSIS — W07XXXA Fall from chair, initial encounter: Secondary | ICD-10-CM | POA: Insufficient documentation

## 2013-06-19 NOTE — ED Provider Notes (Signed)
CSN: [id number]     Arrival date & time 06/19/13  0515 History   First MD Initiated Contact with Patient 06/19/13 [phone]     Chief Complaint  Patient presents with  . Fall     (Consider location/radiation/quality/duration/timing/severity/associated sxs/prior Treatment) HPI Comments: Patient is a 80 year old female with a past medical history of hypertension, dementia, and previous stroke who presents after a mechanical fall that occurred prior to arrival. History provided by EMS who reports the patient fell out of a chair and hit her forehead. No LOC. Patient reports mild to moderate pain at her forehead without radiation. Patient denies any other injuries. No aggravating/alleviating factors. No associated symptoms.    Past Medical History  Diagnosis Date  . Hypertension   . Dementia   . Stroke   . Hyperlipidemia   . Renal disorder   . Renal failure    Past Surgical History  Procedure Laterality Date  . Femur im nail Right 01/25/2013    Procedure: INTRAMEDULLARY (IM) NAIL FEMORAL;  Surgeon: Sheral Apley, MD;  Location: [facility] OR;  Service: Orthopedics;  Laterality: Right;   No family history on file. History  Substance Use Topics  . Smoking status: Unknown If Ever Smoked  . Smokeless tobacco: Not on file  . Alcohol Use: Not on file   OB History   Grav Para Term Preterm Abortions TAB SAB Ect Mult Living                 Review of Systems  Constitutional: Negative for fever, chills and fatigue.  HENT: Positive for facial swelling. Negative for trouble swallowing.   Eyes: Negative for visual disturbance.  Respiratory: Negative for shortness of breath.   Cardiovascular: Negative for chest pain and palpitations.  Gastrointestinal: Negative for nausea, vomiting, abdominal pain and diarrhea.  Genitourinary: Negative for dysuria and difficulty urinating.  Musculoskeletal: Negative for arthralgias and neck pain.  Skin: Negative for color change.  Neurological: Negative for  dizziness and weakness.  Psychiatric/Behavioral: Negative for dysphoric mood.      Allergies  Review of patient's allergies indicates no known allergies.  Home Medications   Current Outpatient Rx  Name  Route  Sig  Dispense  Refill  . acetaminophen (TYLENOL) 500 MG tablet   Oral   Take 500-1,000 mg by mouth every 6 (six) hours as needed for moderate pain.         Marland Kitchen amLODipine (NORVASC) 2.5 MG tablet   Oral   Take 2.5 mg by mouth daily.         Marland Kitchen aspirin 325 MG tablet   Oral   Take 325 mg by mouth daily.         . clopidogrel (PLAVIX) 75 MG tablet   Oral   Take 75 mg by mouth daily.         Marland Kitchen docusate sodium 100 MG CAPS   Oral   Take 100 mg by mouth 2 (two) times daily.   10 capsule   0   . dorzolamide (TRUSOPT) 2 % ophthalmic solution   Both Eyes   Place 1 drop into both eyes 2 (two) times daily.         Marland Kitchen latanoprost (XALATAN) 0.005 % ophthalmic solution   Both Eyes   Place 1 drop into both eyes at bedtime.          Marland Kitchen LORazepam (ATIVAN) 1 MG tablet   Oral   Take 1 mg by mouth every 6 (six) hours as  needed for anxiety.         . metoprolol succinate (TOPROL-XL) 25 MG 24 hr tablet   Oral   Take 25 mg by mouth daily.         . mirtazapine (REMERON) 7.5 MG tablet   Oral   Take 7.5 mg by mouth at bedtime.         . polyethylene glycol (MIRALAX / GLYCOLAX) packet   Oral   Take 17 g by mouth daily as needed for moderate constipation.         . pravastatin (PRAVACHOL) 40 MG tablet   Oral   Take 40 mg by mouth daily.         . QUEtiapine (SEROQUEL) 50 MG tablet   Oral   Take 50 mg by mouth 2 (two) times daily.         . tamsulosin (FLOMAX) 0.4 MG CAPS capsule   Oral   Take 1 capsule (0.4 mg total) by mouth daily.   10 capsule   0   . meclizine (ANTIVERT) 25 MG tablet   Oral   Take 25 mg by mouth 3 (three) times daily as needed for dizziness.          BP 129/47  Pulse 65  Resp 16  SpO2 97% Physical Exam  Nursing note  and vitals reviewed. Constitutional: She is oriented to person, place, and time. She appears well-developed and well-nourished. No distress.  HENT:  Head: Normocephalic and atraumatic.  Mouth/Throat: Oropharynx is clear and moist. No oropharyngeal exudate.  Large hematoma with punctate laceration of left central forehead. The area is tender to palpation. No other scalp hematomas palpated.   Eyes: Conjunctivae and EOM are normal. Pupils are equal, round, and reactive to light.  Neck: Normal range of motion.  Cardiovascular: Normal rate and regular rhythm.  Exam reveals no gallop and no friction rub.   No murmur heard. Pulmonary/Chest: Effort normal and breath sounds normal. She has no wheezes. She has no rales. She exhibits no tenderness.  Abdominal: Soft. She exhibits no distension. There is no tenderness. There is no rebound and no guarding.  Musculoskeletal: Normal range of motion.  Neurological: She is alert and oriented to person, place, and time. No cranial nerve deficit. Coordination normal.  Extremity strength and sensation equal and intact bilaterally.   Skin: Skin is warm and dry.  Psychiatric: She has a normal mood and affect. Her behavior is normal.    ED Course  Procedures (including critical care time) Labs Review Labs Reviewed - No data to display Imaging Review Ct Head Wo Contrast  06/19/2013   CLINICAL DATA:  Pain post trauma  EXAM: CT HEAD WITHOUT CONTRAST  CT CERVICAL SPINE WITHOUT CONTRAST  TECHNIQUE: Multidetector CT imaging of the head and cervical spine was performed following the standard protocol without intravenous contrast. Multiplanar CT image reconstructions of the cervical spine were also generated.  COMPARISON:  June 16, 2013  FINDINGS: CT HEAD FINDINGS  There is mild diffuse atrophy. There is no mass, hemorrhage, extra-axial fluid collection, or midline shift. There is evidence of a prior infarct in the mid right occipital lobe. There is a prior infarct at the  level of the junction of the right temporal, frontal, and parietal lobes, stable. There is patchy small vessel disease in the centra semiovale bilaterally. There is no acute appearing infarct. No new gray-white compartment lesion.  There is a sizable left frontal scalp hematoma. Bony calvarium appears intact. The mastoid air cells  are clear.  CT CERVICAL SPINE FINDINGS  There is no fracture. Minimal anterolisthesis of C3 on C4 is stable and felt to be due to spondylosis. There is no new spondylolisthesis. Prevertebral soft tissues and predental space regions are normal.  There is marked disc space narrowing at all levels except for C2-3. There is mild disc space narrowing at C2-3. There is multilevel facet hypertrophy with exit foraminal narrowing at most levels bilaterally. There is no frank disc extrusion or stenosis.  The thyroid shows diffuse inhomogeneity.  IMPRESSION: CT head: Atrophy with prior infarcts and small vessel disease as noted above. There is no intracranial mass, hemorrhage, or extra-axial fluid collection. There is a sizable left frontal scalp hematoma. No fracture identified.  CT cervical spine: Extensive spondylosis and osteoarthritic change. Slight spondylolisthesis at C3-4 is stable and felt to be due to underlying spondylosis. There is no new spondylolisthesis. No fracture.  Diffuse inhomogeneity in the thyroid. Nonemergent thyroid ultrasound to further evaluate may be warranted.   Electronically Signed   By: Bretta BangWilliam  Woodruff M.D.   On: 06/19/2013 07:06   Ct Cervical Spine Wo Contrast  06/19/2013   CLINICAL DATA:  Pain post trauma  EXAM: CT HEAD WITHOUT CONTRAST  CT CERVICAL SPINE WITHOUT CONTRAST  TECHNIQUE: Multidetector CT imaging of the head and cervical spine was performed following the standard protocol without intravenous contrast. Multiplanar CT image reconstructions of the cervical spine were also generated.  COMPARISON:  June 16, 2013  FINDINGS: CT HEAD FINDINGS  There is mild  diffuse atrophy. There is no mass, hemorrhage, extra-axial fluid collection, or midline shift. There is evidence of a prior infarct in the mid right occipital lobe. There is a prior infarct at the level of the junction of the right temporal, frontal, and parietal lobes, stable. There is patchy small vessel disease in the centra semiovale bilaterally. There is no acute appearing infarct. No new gray-white compartment lesion.  There is a sizable left frontal scalp hematoma. Bony calvarium appears intact. The mastoid air cells are clear.  CT CERVICAL SPINE FINDINGS  There is no fracture. Minimal anterolisthesis of C3 on C4 is stable and felt to be due to spondylosis. There is no new spondylolisthesis. Prevertebral soft tissues and predental space regions are normal.  There is marked disc space narrowing at all levels except for C2-3. There is mild disc space narrowing at C2-3. There is multilevel facet hypertrophy with exit foraminal narrowing at most levels bilaterally. There is no frank disc extrusion or stenosis.  The thyroid shows diffuse inhomogeneity.  IMPRESSION: CT head: Atrophy with prior infarcts and small vessel disease as noted above. There is no intracranial mass, hemorrhage, or extra-axial fluid collection. There is a sizable left frontal scalp hematoma. No fracture identified.  CT cervical spine: Extensive spondylosis and osteoarthritic change. Slight spondylolisthesis at C3-4 is stable and felt to be due to underlying spondylosis. There is no new spondylolisthesis. No fracture.  Diffuse inhomogeneity in the thyroid. Nonemergent thyroid ultrasound to further evaluate may be warranted.   Electronically Signed   By: Bretta BangWilliam  Woodruff M.D.   On: 06/19/2013 07:06     EKG Interpretation None      MDM   Final diagnoses:  Fall  Traumatic hematoma of forehead    6:49 AM CT head and cervical spine pending. No other injuries noted on physical exam.   7:50 AM Patient's imaging unremarkable for  acute changes. Patient has no neuro deficits. She is alert and oriented. Patient will be discharged  back to [facility] without further evaluation.   Emilia Beck, [place] 06/19/13 [phone]

## 2013-06-19 NOTE — ED Provider Notes (Signed)
Medical screening examination/treatment/procedure(s) were performed by non-physician practitioner and as supervising physician I was immediately available for consultation/collaboration.   EKG Interpretation None        Rafe Mackowski, MD 06/19/13 2323 

## 2013-06-19 NOTE — Discharge Instructions (Signed)
Imaging here in the ED is unremarkable for acute changes besides forehead hematoma. Patient should return to the ED with worsening or concerning symptoms.

## 2013-06-19 NOTE — ED Notes (Signed)
Patient transported to CT 

## 2013-06-19 NOTE — ED Notes (Signed)
Pts daughter called in, informed that [facility] be [facility]

## 2013-06-19 NOTE — ED Notes (Signed)
Per EMS pt is from [facility]. EMS reports the pt fell out of her chair and landed on her face. EMS states the pt did not lose consciousness. Pt presents to the department with a hematoma to her left forehead. EMS states the pt's pain is a 4/10. EMS also reports the pt takes plavix and has generalized weakness in both of her lower extremities which is not new.

## 2013-06-21 ENCOUNTER — Emergency Department (HOSPITAL_COMMUNITY): Payer: PRIVATE HEALTH INSURANCE

## 2013-06-21 ENCOUNTER — Encounter (HOSPITAL_COMMUNITY): Payer: Self-pay | Admitting: Emergency Medicine

## 2013-06-21 ENCOUNTER — Emergency Department (HOSPITAL_COMMUNITY)
Admission: EM | Admit: 2013-06-21 | Discharge: 2013-06-21 | Disposition: A | Discharge status: Skilled Nursing Facility | Payer: PRIVATE HEALTH INSURANCE | Attending: Emergency Medicine | Admitting: Emergency Medicine

## 2013-06-21 DIAGNOSIS — W19XXXA Unspecified fall, initial encounter: Secondary | ICD-10-CM

## 2013-06-21 DIAGNOSIS — Z87448 Personal history of other diseases of urinary system: Secondary | ICD-10-CM | POA: Insufficient documentation

## 2013-06-21 DIAGNOSIS — S79919A Unspecified injury of unspecified hip, initial encounter: Secondary | ICD-10-CM | POA: Insufficient documentation

## 2013-06-21 DIAGNOSIS — S0003XA Contusion of scalp, initial encounter: Secondary | ICD-10-CM | POA: Insufficient documentation

## 2013-06-21 DIAGNOSIS — S79929A Unspecified injury of unspecified thigh, initial encounter: Secondary | ICD-10-CM

## 2013-06-21 DIAGNOSIS — Y9210 Unspecified residential institution as the place of occurrence of the external cause: Secondary | ICD-10-CM | POA: Insufficient documentation

## 2013-06-21 DIAGNOSIS — Z79899 Other long term (current) drug therapy: Secondary | ICD-10-CM | POA: Insufficient documentation

## 2013-06-21 DIAGNOSIS — Y9389 Activity, other specified: Secondary | ICD-10-CM | POA: Insufficient documentation

## 2013-06-21 DIAGNOSIS — Z8673 Personal history of transient ischemic attack (TIA), and cerebral infarction without residual deficits: Secondary | ICD-10-CM | POA: Insufficient documentation

## 2013-06-21 DIAGNOSIS — S0010XA Contusion of unspecified eyelid and periocular area, initial encounter: Secondary | ICD-10-CM | POA: Insufficient documentation

## 2013-06-21 DIAGNOSIS — W06XXXA Fall from bed, initial encounter: Secondary | ICD-10-CM | POA: Insufficient documentation

## 2013-06-21 DIAGNOSIS — Z7902 Long term (current) use of antithrombotics/antiplatelets: Secondary | ICD-10-CM | POA: Insufficient documentation

## 2013-06-21 DIAGNOSIS — I10 Essential (primary) hypertension: Secondary | ICD-10-CM | POA: Insufficient documentation

## 2013-06-21 DIAGNOSIS — M25551 Pain in right hip: Secondary | ICD-10-CM

## 2013-06-21 DIAGNOSIS — F0390 Unspecified dementia without behavioral disturbance: Secondary | ICD-10-CM | POA: Insufficient documentation

## 2013-06-21 DIAGNOSIS — S1093XA Contusion of unspecified part of neck, initial encounter: Secondary | ICD-10-CM

## 2013-06-21 DIAGNOSIS — Z7982 Long term (current) use of aspirin: Secondary | ICD-10-CM | POA: Insufficient documentation

## 2013-06-21 DIAGNOSIS — E785 Hyperlipidemia, unspecified: Secondary | ICD-10-CM | POA: Insufficient documentation

## 2013-06-21 DIAGNOSIS — S0083XA Contusion of other part of head, initial encounter: Secondary | ICD-10-CM

## 2013-06-21 DIAGNOSIS — Y92129 Unspecified place in nursing home as the place of occurrence of the external cause: Secondary | ICD-10-CM

## 2013-06-21 NOTE — ED Provider Notes (Addendum)
CSN: [id number]     Arrival date & time 06/21/13  1437 History   First MD Initiated Contact with Patient 06/21/13 1503     Chief Complaint  Patient presents with  . Fall   Level V caveat for dementia  (Consider location/radiation/quality/duration/timing/severity/associated sxs/prior Treatment) HPI Patient presents via EMS from her nursing home after having had a fall at her facility. They report she rolled out of her bed, they state she possibly hit her head. Patient is noted have bruising and swelling of her left eye. Her last ED visit which was March 7 it was noted she had a hematoma in her left forehead. Patient only describes to me pain in her right hip.  PCP Laurann Montana  Past Medical History  Diagnosis Date  . Hypertension   . Dementia   . Stroke   . Hyperlipidemia   . Renal disorder   . Renal failure    Past Surgical History  Procedure Laterality Date  . Femur im nail Right 01/25/2013    Procedure: INTRAMEDULLARY (IM) NAIL FEMORAL;  Surgeon: Sheral Apley, MD;  Location: [facility] OR;  Service: Orthopedics;  Laterality: Right;   No family history on file. History  Substance Use Topics  . Smoking status: Unknown If Ever Smoked  . Smokeless tobacco: Not on file  . Alcohol Use: Not on file  lives in [place]   OB History   Grav Para Term Preterm Abortions TAB SAB Ect Mult Living                 Review of Systems  Unable to perform ROS: Dementia      Allergies  Review of patient's allergies indicates no known allergies.  Home Medications   Current Outpatient Rx  Name  Route  Sig  Dispense  Refill  . amLODipine (NORVASC) 2.5 MG tablet   Oral   Take 2.5 mg by mouth daily.         Marland Kitchen aspirin 325 MG tablet   Oral   Take 325 mg by mouth daily.         . clopidogrel (PLAVIX) 75 MG tablet   Oral   Take 75 mg by mouth daily.         Marland Kitchen docusate sodium 100 MG CAPS   Oral   Take 100 mg by mouth 2 (two) times daily.   10 capsule   0   . dorzolamide  (TRUSOPT) 2 % ophthalmic solution   Both Eyes   Place 1 drop into both eyes 2 (two) times daily.         Marland Kitchen latanoprost (XALATAN) 0.005 % ophthalmic solution   Both Eyes   Place 1 drop into both eyes at bedtime.          Marland Kitchen LORazepam (ATIVAN) 1 MG tablet   Oral   Take 1 mg by mouth every 6 (six) hours as needed for anxiety.         . meclizine (ANTIVERT) 25 MG tablet   Oral   Take 25 mg by mouth 3 (three) times daily as needed for dizziness.         . metoprolol succinate (TOPROL-XL) 25 MG 24 hr tablet   Oral   Take 25 mg by mouth daily.         . mirtazapine (REMERON) 7.5 MG tablet   Oral   Take 7.5 mg by mouth at bedtime.         . polyethylene glycol (MIRALAX / GLYCOLAX)  packet   Oral   Take 17 g by mouth daily as needed for moderate constipation.         . QUEtiapine (SEROQUEL) 50 MG tablet   Oral   Take 50 mg by mouth 2 (two) times daily.         . sertraline (ZOLOFT) 50 MG tablet   Oral   Take 50 mg by mouth at bedtime.         . tamsulosin (FLOMAX) 0.4 MG CAPS capsule   Oral   Take 1 capsule (0.4 mg total) by mouth daily.   10 capsule   0   . acetaminophen (TYLENOL) 500 MG tablet   Oral   Take 500-1,000 mg by mouth every 6 (six) hours as needed for moderate pain.          BP 170/73  Pulse 68  Temp(Src) 97.7 F (36.5 C) (Oral)  Resp 16  SpO2 100%  Vital signs normal except hypertension  Physical Exam  Nursing note and vitals reviewed. Constitutional: She is oriented to person, place, and time. She appears well-developed and well-nourished.  Non-toxic appearance. She does not appear ill. No distress.  HENT:  Head: Normocephalic.  Right Ear: External ear normal.  Left Ear: External ear normal.  Nose: Nose normal. No mucosal edema or rhinorrhea.  Mouth/Throat: Oropharynx is clear and moist and mucous membranes are normal. No dental abscesses or uvula swelling.  Patient is noted to have some bruising of her upper and lower eyelids  on the left, she has some dried blood around her eyebrow. She is noted to have a mild amount of swelling. Please see photo below.  Eyes: Conjunctivae and EOM are normal. Pupils are equal, round, and reactive to light.  Neck: Normal range of motion and full passive range of motion without pain. Neck supple.  Cardiovascular: Normal rate, regular rhythm and normal heart sounds.  Exam reveals no gallop and no friction rub.   No murmur heard. Pulmonary/Chest: Effort normal and breath sounds normal. No respiratory distress. She has no wheezes. She has no rhonchi. She has no rales. She exhibits no tenderness and no crepitus.  Abdominal: Soft. Normal appearance and bowel sounds are normal. She exhibits no distension. There is no tenderness. There is no rebound and no guarding.  Musculoskeletal: Normal range of motion. She exhibits no edema and no tenderness.  Moves all extremities well, although she consistently states her right hip hurts and puts her right hand on it when she moves it. There is no obvious deformity. There is no shortening or external rotation of her leg seen.  Neurological: She is alert and oriented to person, place, and time. She has normal strength. No cranial nerve deficit.  Skin: Skin is warm, dry and intact. No rash noted. No erythema. No pallor.  Psychiatric: She has a normal mood and affect. Her speech is normal and behavior is normal. Her mood appears not anxious.       ED Course  Procedures (including critical care time)   Review of prior ED visits shows on March 7 she had a urine culture sent that showed no growth, on March 1 she had labs and another UA done that was normal. She did have an anemia that is chronic.   Labs Review Labs Reviewed - No data to display Imaging Review Dg Hip Complete Right  06/21/2013   CLINICAL DATA:  Fall  EXAM: RIGHT HIP - COMPLETE 2+ VIEW  COMPARISON:  04/30/2013  FINDINGS: Intertrochanteric fracture  on the right has been fixed with a  compression screw and intra medullary rod. The screw has backed out partially from the rod, unchanged from the prior study but different from the operative image of 01/25/2013. There is angulation at the fracture site which is unchanged from the recent study.  Negative for acute fracture.  No change from the prior study.  IMPRESSION: Fracture deformity and ORIF is unchanged from 04/30/2013. No acute fracture.   Electronically Signed   By: Marlan Palauharles  Clark M.D.   On: 06/21/2013 15:49   Ct Head Wo Contrast  Ct Maxillofacial Wo Cm  06/21/2013   CLINICAL DATA:  Patient rolled out of bed. Headache. Left facial pain.  EXAM: CT HEAD WITHOUT CONTRAST  CT MAXILLOFACIAL WITHOUT CONTRAST  TECHNIQUE: Multidetector CT imaging of the head and maxillofacial structures were performed using the standard protocol without intravenous contrast. Multiplanar CT image reconstructions of the maxillofacial structures were also generated.  COMPARISON:  None.  FINDINGS: CT HEAD FINDINGS  There is no evidence of mass effect, midline shift, or extra-axial fluid collections. There is no evidence of a space-occupying lesion or intracranial hemorrhage. There is no evidence of a cortical-based area of acute infarction. There are areas of encephalomalacia involving the right frontal lobe and right parietal lobe from prior insult. There is generalized cerebral atrophy. There is periventricular white matter low attenuation likely secondary to microangiopathy.  The ventricles and sulci are appropriate for the patient's age. The basal cisterns are patent.  Visualized portions of the orbits are unremarkable. The visualized portions of the paranasal sinuses and mastoid air cells are unremarkable. Cerebrovascular atherosclerotic calcifications are noted.  The osseous structures are unremarkable. There is severe left frontal scalp soft tissue swelling.  CT MAXILLOFACIAL FINDINGS  There is dental hardware resulting in beam hardening artifact which  partially obscures adjacent soft tissue and osseous structures.  The globes are intact. The orbital walls are intact. The orbital floors are intact. The maxilla is intact. The mandible is intact. The zygomatic arches are intact. The nasal septum is midline. There is no nasal bone fracture. The temporomandibular joints are normal.  There is degenerative disc disease of the cervical spine most significant at C3-4, C5-6 and to a lesser degree C6-7. There is bilateral facet arthropathy throughout the cervical spine.  There is soft tissue swelling in the left supraorbital soft tissues. The paranasal sinuses are clear. The visualized portions of the mastoid sinuses are well aerated.  IMPRESSION: 1. No acute intracranial pathology. 2. No acute osseous injury of the maxillofacial bones. 3. Left supraorbital and left frontal scalp soft tissue swelling.   Electronically Signed   By: Elige KoHetal  Patel   On: 06/21/2013 15:38     EKG Interpretation None      MDM  patient presents with her third fall this month from her nursing facility. She had a hematoma on her left forehead on her last visit March 7, she now has some bruising of her eyelids. This may be from her new fall today or possibly bruising settling from her left forehead injury 5 days ago. Labs were not done, she has had recent laboratory testing done.    Final diagnoses:  Fall at nursing home  Contusion of face  Hip pain, right    Plan discharge  Devoria AlbeIva Jaina Morin, MD, [facility]     Dorrance Sellick L Cathern Tahir, MD 06/21/13 1617  Ward GivensIva L Darric Plante, MD 06/21/13 1625

## 2013-06-21 NOTE — ED Notes (Signed)
Pt discharge complete. [facility] contacted, waiting for pt pick up.

## 2013-06-21 NOTE — ED Notes (Signed)
Pt in by EMS, from [facility]. Staff reports pt rolled out of bed and possibly hit head, pt c/o soreness to head and L eye. Denies back pain. Pt seen a few days ago in ER for fall.

## 2013-06-21 NOTE — ED Notes (Signed)
Bed: [id number] Expected date:  Expected time:  Means of arrival:  Comments: EMS Elderly fall

## 2013-06-21 NOTE — Discharge Instructions (Signed)
Ice packs to the bruised areas. Return to the ED for any problems listed on the head injury sheet.

## 2013-06-30 ENCOUNTER — Emergency Department (HOSPITAL_COMMUNITY)
Admission: EM | Admit: 2013-06-30 | Discharge: 2013-06-30 | Disposition: A | Discharge status: Home / Self Care | Payer: PRIVATE HEALTH INSURANCE | Attending: Emergency Medicine | Admitting: Emergency Medicine

## 2013-06-30 ENCOUNTER — Encounter (HOSPITAL_COMMUNITY): Payer: Self-pay | Admitting: Emergency Medicine

## 2013-06-30 ENCOUNTER — Emergency Department (HOSPITAL_COMMUNITY): Payer: PRIVATE HEALTH INSURANCE

## 2013-06-30 DIAGNOSIS — I10 Essential (primary) hypertension: Secondary | ICD-10-CM | POA: Insufficient documentation

## 2013-06-30 DIAGNOSIS — F0390 Unspecified dementia without behavioral disturbance: Secondary | ICD-10-CM | POA: Insufficient documentation

## 2013-06-30 DIAGNOSIS — Z7902 Long term (current) use of antithrombotics/antiplatelets: Secondary | ICD-10-CM | POA: Insufficient documentation

## 2013-06-30 DIAGNOSIS — Z8673 Personal history of transient ischemic attack (TIA), and cerebral infarction without residual deficits: Secondary | ICD-10-CM | POA: Insufficient documentation

## 2013-06-30 DIAGNOSIS — Z79899 Other long term (current) drug therapy: Secondary | ICD-10-CM | POA: Insufficient documentation

## 2013-06-30 DIAGNOSIS — Z8639 Personal history of other endocrine, nutritional and metabolic disease: Secondary | ICD-10-CM | POA: Insufficient documentation

## 2013-06-30 DIAGNOSIS — Z87448 Personal history of other diseases of urinary system: Secondary | ICD-10-CM | POA: Insufficient documentation

## 2013-06-30 DIAGNOSIS — R079 Chest pain, unspecified: Secondary | ICD-10-CM

## 2013-06-30 DIAGNOSIS — Z862 Personal history of diseases of the blood and blood-forming organs and certain disorders involving the immune mechanism: Secondary | ICD-10-CM | POA: Insufficient documentation

## 2013-06-30 LAB — I-STAT TROPONIN, ED: Troponin i, poc: 0.02 ng/mL (ref 0.00–0.08)

## 2013-06-30 LAB — I-STAT CHEM 8, ED
BUN: 36 mg/dL — AB (ref 6–23)
CALCIUM ION: 1.23 mmol/L (ref 1.13–1.30)
CREATININE: 1.5 mg/dL — AB (ref 0.50–1.10)
Chloride: 105 mEq/L (ref 96–112)
Glucose, Bld: 130 mg/dL — ABNORMAL HIGH (ref 70–99)
HCT: 32 % — ABNORMAL LOW (ref 36.0–46.0)
Hemoglobin: 10.9 g/dL — ABNORMAL LOW (ref 12.0–15.0)
Potassium: 4.4 mEq/L (ref 3.7–5.3)
Sodium: 145 mEq/L (ref 137–147)
TCO2: 25 mmol/L (ref 0–100)

## 2013-06-30 NOTE — Discharge Instructions (Signed)
Chest Pain (Nonspecific) °It is often hard to give a specific diagnosis for the cause of chest pain. There is always a chance that your pain could be related to something serious, such as a heart attack or a blood clot in the lungs. You need to follow up with your caregiver for further evaluation. °CAUSES  °· Heartburn. °· Pneumonia or bronchitis. °· Anxiety or stress. °· Inflammation around your heart (pericarditis) or lung (pleuritis or pleurisy). °· A blood clot in the lung. °· A collapsed lung (pneumothorax). It can develop suddenly on its own (spontaneous pneumothorax) or from injury (trauma) to the chest. °· Shingles infection (herpes zoster virus). °The chest wall is composed of bones, muscles, and cartilage. Any of these can be the source of the pain. °· The bones can be bruised by injury. °· The muscles or cartilage can be strained by coughing or overwork. °· The cartilage can be affected by inflammation and become sore (costochondritis). °DIAGNOSIS  °Lab tests or other studies, such as X-rays, electrocardiography, stress testing, or cardiac imaging, may be needed to find the cause of your pain.  °TREATMENT  °· Treatment depends on what may be causing your chest pain. Treatment may include: °· Acid blockers for heartburn. °· Anti-inflammatory medicine. °· Pain medicine for inflammatory conditions. °· Antibiotics if an infection is present. °· You may be advised to change lifestyle habits. This includes stopping smoking and avoiding alcohol, caffeine, and chocolate. °· You may be advised to keep your head raised (elevated) when sleeping. This reduces the chance of acid going backward from your stomach into your esophagus. °· Most of the time, nonspecific chest pain will improve within 2 to 3 days with rest and mild pain medicine. °HOME CARE INSTRUCTIONS  °· If antibiotics were prescribed, take your antibiotics as directed. Finish them even if you start to feel better. °· For the next few days, avoid physical  activities that bring on chest pain. Continue physical activities as directed. °· Do not smoke. °· Avoid drinking alcohol. °· Only take over-the-counter or prescription medicine for pain, discomfort, or fever as directed by your caregiver. °· Follow your caregiver's suggestions for further testing if your chest pain does not go away. °· Keep any follow-up appointments you made. If you do not go to an appointment, you could develop lasting (chronic) problems with pain. If there is any problem keeping an appointment, you must call to reschedule. °SEEK MEDICAL CARE IF:  °· You think you are having problems from the medicine you are taking. Read your medicine instructions carefully. °· Your chest pain does not go away, even after treatment. °· You develop a rash with blisters on your chest. °SEEK IMMEDIATE MEDICAL CARE IF:  °· You have increased chest pain or pain that spreads to your arm, neck, jaw, back, or abdomen. °· You develop shortness of breath, an increasing cough, or you are coughing up blood. °· You have severe back or abdominal pain, feel nauseous, or vomit. °· You develop severe weakness, fainting, or chills. °· You have a fever. °THIS IS AN EMERGENCY. Do not wait to see if the pain will go away. Get medical help at once. Call your local emergency services (911 in [place] Do not drive yourself to the hospital. °MAKE SURE YOU:  °· Understand these instructions. °· Will watch your condition. °· Will get help right away if you are not doing well or get worse. °Document Released: 01/06/2005 Document Revised: 06/21/2011 Document Reviewed: 11/02/2007 °ExitCare® Patient Information ©2014 [employer]   [employer]. ° °

## 2013-06-30 NOTE — ED Provider Notes (Signed)
CSN: [id number]     Arrival date & time 06/30/13  1628 History   First MD Initiated Contact with Patient 06/30/13 1701     Chief Complaint  Patient presents with  . Chest Pain     (Consider location/radiation/quality/duration/timing/severity/associated sxs/prior Treatment) HPI Comments: Patient presents from a nursing facility with chest pain. She has a history of advanced dementia. She reported to the staff at about 4 AM this morning that she's having some chest pain. She was not given any medications at that time. The staff reports that later in the morning she received some flowers and at that time the chest pain was resolved. She doesn't really complain of any ongoing chest pain. She did not have any reported shortness of breath or diaphoresis. She's had no vomiting. History is limited secondary to her dementia.  Patient is a 80 y.o. female presenting with chest pain.  Chest Pain   Past Medical History  Diagnosis Date  . Hypertension   . Dementia   . Stroke   . Hyperlipidemia   . Renal disorder   . Renal failure    Past Surgical History  Procedure Laterality Date  . Femur im nail Right 01/25/2013    Procedure: INTRAMEDULLARY (IM) NAIL FEMORAL;  Surgeon: Sheral Apley, MD;  Location: [facility] OR;  Service: Orthopedics;  Laterality: Right;   No family history on file. History  Substance Use Topics  . Smoking status: Unknown If Ever Smoked  . Smokeless tobacco: Not on file  . Alcohol Use: Not on file   OB History   Grav Para Term Preterm Abortions TAB SAB Ect Mult Living                 Review of Systems  Unable to perform ROS: Dementia  Cardiovascular: Positive for chest pain.      Allergies  Review of patient's allergies indicates no known allergies.  Home Medications   Current Outpatient Rx  Name  Route  Sig  Dispense  Refill  . acetaminophen (TYLENOL) 500 MG tablet   Oral   Take 500-1,000 mg by mouth every 6 (six) hours as needed for moderate pain (2  tablets for severe pain).          Marland Kitchen amLODipine (NORVASC) 2.5 MG tablet   Oral   Take 2.5 mg by mouth daily.         . clopidogrel (PLAVIX) 75 MG tablet   Oral   Take 75 mg by mouth daily.         Marland Kitchen docusate sodium 100 MG CAPS   Oral   Take 100 mg by mouth 2 (two) times daily.   10 capsule   0   . dorzolamide (TRUSOPT) 2 % ophthalmic solution   Both Eyes   Place 1 drop into both eyes 2 (two) times daily.         Marland Kitchen HYDROcodone-acetaminophen (NORCO/VICODIN) 5-325 MG per tablet   Oral   Take 1 tablet by mouth every 4 (four) hours as needed for moderate pain.         Marland Kitchen latanoprost (XALATAN) 0.005 % ophthalmic solution   Both Eyes   Place 1 drop into both eyes at bedtime.          Marland Kitchen LORazepam (ATIVAN) 1 MG tablet   Oral   Take 1 mg by mouth every 6 (six) hours as needed for anxiety.         . meclizine (ANTIVERT) 25 MG tablet   Oral  Take 25 mg by mouth 3 (three) times daily as needed for dizziness.         . metoprolol succinate (TOPROL-XL) 25 MG 24 hr tablet   Oral   Take 25 mg by mouth daily.         . mirtazapine (REMERON) 7.5 MG tablet   Oral   Take 7.5 mg by mouth at bedtime.         . polyethylene glycol (MIRALAX / GLYCOLAX) packet   Oral   Take 17 g by mouth daily as needed for moderate constipation.         . sertraline (ZOLOFT) 50 MG tablet   Oral   Take 50 mg by mouth at bedtime.         . tamsulosin (FLOMAX) 0.4 MG CAPS capsule   Oral   Take 1 capsule (0.4 mg total) by mouth daily.   10 capsule   0    BP 167/69  Pulse 73  Temp(Src) 97.9 F (36.6 C) (Oral)  Resp 15  SpO2 100% Physical Exam  Constitutional: She appears well-developed and well-nourished.  HENT:  Head: Normocephalic and atraumatic.  Eyes: Pupils are equal, round, and reactive to light.  Neck: Normal range of motion. Neck supple.  Cardiovascular: Normal rate, regular rhythm and normal heart sounds.   Pulmonary/Chest: Effort normal and breath sounds  normal. No respiratory distress. She has no wheezes. She has no rales. She exhibits no tenderness.  No rashes, signs of trauma to chest wall  Abdominal: Soft. Bowel sounds are normal. There is no tenderness. There is no rebound and no guarding.  Musculoskeletal: Normal range of motion. She exhibits no edema.  Lymphadenopathy:    She has no cervical adenopathy.  Neurological: She is alert.  confused  Skin: Skin is warm and dry. No rash noted.  Psychiatric: She has a normal mood and affect.    ED Course  Procedures (including critical care time) Labs Review Results for orders placed during the hospital encounter of 06/30/13  I-STAT CHEM 8, ED      Result Value Ref Range   Sodium 145  137 - 147 mEq/L   Potassium 4.4  3.7 - 5.3 mEq/L   Chloride 105  96 - 112 mEq/L   BUN 36 (*) 6 - 23 mg/dL   Creatinine, Ser 4.09 (*) 0.50 - 1.10 mg/dL   Glucose, Bld 811 (*) 70 - 99 mg/dL   Calcium, Ion 9.14  7.82 - 1.30 mmol/L   TCO2 25  0 - 100 mmol/L   Hemoglobin 10.9 (*) 12.0 - 15.0 g/dL   HCT 95.6 (*) 21.3 - 08.6 %  I-STAT TROPOININ, ED      Result Value Ref Range   Troponin i, poc 0.02  0.00 - 0.08 ng/mL   Comment 3            Dg Chest 2 View  06/30/2013   CLINICAL DATA:  Chest pain  EXAM: CHEST  2 VIEW  COMPARISON:  06/10/2013  FINDINGS: Patient is rotated.  Lungs are essentially clear. No focal consolidation. No pleural effusion or pneumothorax.  The heart is normal in size.  Degenerative changes of the visualized thoracolumbar spine.  Moderate to severe degenerative changes of the right shoulder.  IMPRESSION: No evidence of acute cardiopulmonary disease.   Electronically Signed   By: Charline Bills M.D.   On: 06/30/2013 18:43    Imaging Review Dg Chest 2 View  06/30/2013   CLINICAL DATA:  Chest pain  EXAM: CHEST  2 VIEW  COMPARISON:  06/10/2013  FINDINGS: Patient is rotated.  Lungs are essentially clear. No focal consolidation. No pleural effusion or pneumothorax.  The heart is normal in  size.  Degenerative changes of the visualized thoracolumbar spine.  Moderate to severe degenerative changes of the right shoulder.  IMPRESSION: No evidence of acute cardiopulmonary disease.   Electronically Signed   By: Charline BillsSriyesh  Krishnan M.D.   On: 06/30/2013 18:43     EKG Interpretation   Date/Time:  Saturday June 30 2013 16:43:04 EDT Ventricular Rate:  72 PR Interval:  176 QRS Duration: 82 QT Interval:  398 QTC Calculation: 435 R Axis:   63 Text Interpretation:  Sinus rhythm Nonspecific T abnrm, anterolateral  leads since last tracing no significant change Confirmed by Shakur Lembo  MD,  Andrey Hoobler ([id number]) on 06/30/2013 5:15:10 PM      MDM   Final diagnoses:  Chest pain    Pt has neg troponin, no ischemic changes on EKG.  Creatinine slightly elevated over baseline. Pt without complaints currently.  Will d/c back to NH.    Rolan BuccoMelanie Shah Insley, MD 06/30/13 [phone]

## 2013-06-30 NOTE — ED Notes (Signed)
Pt from [facility] memory care unit. Staff reports pt waking up around 0400 with chest tightness. MD on call stated that pt DID NOT need ASA. Pt recived flowers first thing in the am and CP went away. Pt denies CP, N/V, diaphoreses, or SOB at this time. Pt has advanced alzheimer's.

## 2013-06-30 NOTE — ED Notes (Signed)
Spoke with Daughter and gave her update on pt's status. [facility]

## 2013-08-27 ENCOUNTER — Other Ambulatory Visit: Payer: Self-pay | Admitting: Endocrinology

## 2013-08-27 DIAGNOSIS — E041 Nontoxic single thyroid nodule: Secondary | ICD-10-CM

## 2013-12-25 ENCOUNTER — Encounter: Payer: Self-pay | Admitting: Internal Medicine

## 2013-12-25 ENCOUNTER — Encounter: Payer: Self-pay | Admitting: *Deleted

## 2013-12-25 ENCOUNTER — Non-Acute Institutional Stay (SKILLED_NURSING_FACILITY): Payer: Medicare (Managed Care) | Admitting: Internal Medicine

## 2013-12-25 DIAGNOSIS — M159 Polyosteoarthritis, unspecified: Secondary | ICD-10-CM

## 2013-12-25 DIAGNOSIS — M810 Age-related osteoporosis without current pathological fracture: Secondary | ICD-10-CM

## 2013-12-25 DIAGNOSIS — N183 Chronic kidney disease, stage 3 unspecified: Secondary | ICD-10-CM | POA: Insufficient documentation

## 2013-12-25 DIAGNOSIS — L309 Dermatitis, unspecified: Secondary | ICD-10-CM

## 2013-12-25 DIAGNOSIS — F01518 Vascular dementia, unspecified severity, with other behavioral disturbance: Secondary | ICD-10-CM

## 2013-12-25 DIAGNOSIS — I8310 Varicose veins of unspecified lower extremity with inflammation: Secondary | ICD-10-CM

## 2013-12-25 DIAGNOSIS — H9319 Tinnitus, unspecified ear: Secondary | ICD-10-CM | POA: Insufficient documentation

## 2013-12-25 DIAGNOSIS — L259 Unspecified contact dermatitis, unspecified cause: Secondary | ICD-10-CM

## 2013-12-25 DIAGNOSIS — M150 Primary generalized (osteo)arthritis: Secondary | ICD-10-CM

## 2013-12-25 DIAGNOSIS — F0151 Vascular dementia with behavioral disturbance: Secondary | ICD-10-CM

## 2013-12-25 DIAGNOSIS — I872 Venous insufficiency (chronic) (peripheral): Secondary | ICD-10-CM

## 2013-12-25 DIAGNOSIS — E785 Hyperlipidemia, unspecified: Secondary | ICD-10-CM

## 2013-12-25 DIAGNOSIS — M8949 Other hypertrophic osteoarthropathy, multiple sites: Secondary | ICD-10-CM

## 2013-12-25 DIAGNOSIS — I10 Essential (primary) hypertension: Secondary | ICD-10-CM

## 2013-12-25 DIAGNOSIS — H409 Unspecified glaucoma: Secondary | ICD-10-CM | POA: Insufficient documentation

## 2013-12-25 DIAGNOSIS — F0150 Vascular dementia without behavioral disturbance: Secondary | ICD-10-CM

## 2013-12-25 DIAGNOSIS — E039 Hypothyroidism, unspecified: Secondary | ICD-10-CM

## 2013-12-25 HISTORY — DX: Venous insufficiency (chronic) (peripheral): I87.2

## 2013-12-25 HISTORY — DX: Chronic kidney disease, stage 3 unspecified: N18.30

## 2013-12-25 HISTORY — DX: Age-related osteoporosis without current pathological fracture: M81.0

## 2013-12-25 NOTE — Progress Notes (Signed)
Patient ID: Jacqueline Mccann, female   DOB: Nov 25, 1919, 80 y.o.   MRN: [id number]  Provider:  Gwenith Spitz. Renato Gails, D.O., C.M.D. Location:  [facility]  PCP: No primary provider on file.  Code Status: full code  No Known Allergies  Chief Complaint  Patient presents with  . New Admit To SNF    HPI: 80 y.o. female with vascular dementia, prior stroke, htn, hyperlipidemia, renal failure seen for admission to snf.    Was in locked unit of facility ([facility]) but was taken home by family.   Apparently this did not work out well. Sent from home for PT, OT.  She is constantly disoriented, dependent in bathing, feeding, dressing, nonambulatory, incontinent of bowels and bladder.    ROS: Review of Systems  Unable to perform ROS: dementia  Gastrointestinal:       Incontinent of feces   Genitourinary:       Incontinent  Psychiatric/Behavioral: Positive for hallucinations and memory loss.     Past Medical History  Diagnosis Date  . Hypertension   . Dementia   . Stroke   . Hyperlipidemia   . Renal disorder   . Renal failure    Past Surgical History  Procedure Laterality Date  . Femur im nail Right 01/25/2013    Procedure: INTRAMEDULLARY (IM) NAIL FEMORAL;  Surgeon: Sheral Apley, MD;  Location: [facility] OR;  Service: Orthopedics;  Laterality: Right;   Social History:   has no tobacco, alcohol, and drug history on file.  No family history on file.  Medications: Patient's Medications  New Prescriptions   No medications on file  Previous Medications   ACETAMINOPHEN (TYLENOL) 500 MG TABLET    Take 500-1,000 mg by mouth every 6 (six) hours as needed for moderate pain (2 tablets for severe pain).    AMLODIPINE (NORVASC) 2.5 MG TABLET    Take 2.5 mg by mouth daily.   CLOPIDOGREL (PLAVIX) 75 MG TABLET    Take 75 mg by mouth daily.   DOCUSATE SODIUM 100 MG CAPS    Take 100 mg by mouth 2 (two) times daily.   DORZOLAMIDE (TRUSOPT) 2 % OPHTHALMIC SOLUTION    Place 1  drop into both eyes 2 (two) times daily.   HYDROCODONE-ACETAMINOPHEN (NORCO/VICODIN) 5-325 MG PER TABLET    Take 1 tablet by mouth every 4 (four) hours as needed for moderate pain.   LATANOPROST (XALATAN) 0.005 % OPHTHALMIC SOLUTION    Place 1 drop into both eyes at bedtime.    LORAZEPAM (ATIVAN) 1 MG TABLET    Take 1 mg by mouth every 6 (six) hours as needed for anxiety.   MECLIZINE (ANTIVERT) 25 MG TABLET    Take 25 mg by mouth 3 (three) times daily as needed for dizziness.   METOPROLOL SUCCINATE (TOPROL-XL) 25 MG 24 HR TABLET    Take 25 mg by mouth daily.   MIRTAZAPINE (REMERON) 7.5 MG TABLET    Take 7.5 mg by mouth at bedtime.   POLYETHYLENE GLYCOL (MIRALAX / GLYCOLAX) PACKET    Take 17 g by mouth daily as needed for moderate constipation.   SERTRALINE (ZOLOFT) 50 MG TABLET    Take 50 mg by mouth at bedtime.   TAMSULOSIN (FLOMAX) 0.4 MG CAPS CAPSULE    Take 1 capsule (0.4 mg total) by mouth daily.  Modified Medications   No medications on file  Discontinued Medications   No medications on file     Physical Exam: There were no vitals filed  for this visit. Physical Exam  Constitutional: No distress.  HENT:  Head: Normocephalic and atraumatic.  Eyes: EOM are normal. Pupils are equal, round, and reactive to light.  Neck: Neck supple. No JVD present.  Cardiovascular: Normal rate, regular rhythm, normal heart sounds and intact distal pulses.   Pulmonary/Chest: Effort normal and breath sounds normal. No respiratory distress.  Abdominal: Soft. Bowel sounds are normal. She exhibits no distension and no mass. There is no tenderness.  Musculoskeletal:  Left hand contracture, in wheelchair  Neurological: She is alert.     Labs reviewed: Basic Metabolic Panel:  Recent Labs  40/98/11 0340 04/30/13 0901 06/10/13 1040 06/30/13 1737  NA 138 143 142 145  K 4.8 4.9 4.5 4.4  CL 108 106 105 105  CO2 --   GLUCOSE 139* 98 103* 130*  BUN 38* 23 24* 36*  CREATININE 1.29* 1.36*  1.23* 1.50*  CALCIUM 8.1* 9.3 9.3  --    Liver Function Tests:  Recent Labs  06/10/13 1040  AST 24  ALT 14  ALKPHOS 89  BILITOT 0.4  PROT 7.1  ALBUMIN 3.1*   No results found for this basename: LIPASE, AMYLASE,  in the last 8760 hours No results found for this basename: AMMONIA,  in the last 8760 hours CBC:  Recent Labs  01/24/13 0950  01/28/13 0340 04/30/13 0901 06/10/13 1040 06/30/13 1737  WBC 10.7*  < > 8.6 5.5 4.1  --   NEUTROABS 9.1*  --   --  3.8  --   --   HGB 10.5*  < > 10.4* 11.8* 9.9* 10.9*  HCT 30.3*  < > 29.9* 33.9* 28.7* 32.0*  MCV 88.9  < > 87.9 89.7 92.3  --   PLT 210  < > 116* PLATELETS APPEAR DECREASED 151  --   < > = values in this interval not displayed.   Assessment/Plan 1. Venous stasis dermatitis of both lower extremities -elevate feet at rest, compression hose -consider d/c amlodipine if bp at goal w/o it as it causes edema  2. Vascular dementia with behavioral disturbance -late stages, requires full adl assist and is incontinent, has hallucinations and had wandering behavior--had been in locked unit -now in wheelchair and has contracture of arm due to prior stroke  3. Essential hypertension -bp at goal with amlodipine, toprol-xl  4. Unspecified hypothyroidism -reassess TSH as she is not on synthroid but this dx is on her chart  5. Primary osteoarthritis involving multiple joints -cont tylenol for mild pain and hydrocodone for moderate to severe pain not controlled with tylenol--not to exceed 3g tylenol total per day  6. Eczema -cont low dose steroid creams for this   7. Tinnitus, unspecified laterality -has been treated with meclizine for vertigo (?meniere's)  8. Other and unspecified hyperlipidemia -reassess lipids due to vascular etiology of dementia, not on statin, but we don't have records to know why or if it got stopped due to advanced dementia  9. Senile osteoporosis -should be on vitamin D 2000 units daily at least--need  to add at routine visit  10. Chronic kidney disease, stage III (moderate) -f/u cmp, avoid nsaids and other nephrotoxic agents and monitor renal function if ill or on abx  11. Glaucoma -cont trusopt, xalatan -f/u with ophtho  Functional status: dependent  Family/ staff Communication:  Seen with unit supervisor   Labs/tests ordered:cbc, cmp, tsh, flp next draw

## 2013-12-25 NOTE — Telephone Encounter (Signed)
error 

## 2013-12-27 ENCOUNTER — Other Ambulatory Visit: Payer: Self-pay | Admitting: *Deleted

## 2013-12-27 MED ORDER — LORAZEPAM 0.5 MG PO TABS: ORAL_TABLET | ORAL | Status: DC

## 2013-12-27 NOTE — Telephone Encounter (Signed)
[facility] 

## 2014-01-31 ENCOUNTER — Encounter: Payer: Self-pay | Admitting: Internal Medicine

## 2014-01-31 ENCOUNTER — Non-Acute Institutional Stay (SKILLED_NURSING_FACILITY): Payer: Medicare (Managed Care) | Admitting: Internal Medicine

## 2014-01-31 DIAGNOSIS — R21 Rash and other nonspecific skin eruption: Secondary | ICD-10-CM

## 2014-01-31 NOTE — Progress Notes (Signed)
Patient ID: Jacqueline Mccann, female   DOB: 05/21/1919, 80 y.o.   MRN: [id number]   [facility]  No Known Allergies  Code Status: Full Code Goals of Care: Longevity/Long term care  Chief Complaint  Patient presents with  . Acute Visit    rash    HPI  80 y.o. female with PMH of HTN, vascular dementia, OA, hypothyroidism, CKD, HLD among others is being seen for an acute visit for evaluation of a rash. No falls reported. No change in appetite. Weight stable. No other concern from nursing staff.    Review of Systems Unable to obtained due to dementia  Past Medical History  Diagnosis Date  . Hypertension   . Dementia   . Stroke   . Hyperlipidemia   . Renal disorder   . Renal failure   . Breast cancer in situ   . Nephrolithiasis   . Thyroid nodule     Past Surgical History  Procedure Laterality Date  . Femur im nail Right 01/25/2013    Procedure: INTRAMEDULLARY (IM) NAIL FEMORAL;  Surgeon: Sheral Apleyimothy D Murphy, MD;  Location: [facility] OR;  Service: Orthopedics;  Laterality: Right;    History   Social History  . Marital Status: Widowed    Spouse Name: N/A    Number of Children: N/A  . Years of Education: N/A   Occupational History  . Not on file.   Social History Main Topics  . Smoking status: Unknown If Ever Smoked  . Smokeless tobacco: Not on file  . Alcohol Use: Not on file  . Drug Use: Not on file  . Sexual Activity: Not on file   Other Topics Concern  . Not on file   Social History Narrative  . No narrative on file      Medication List       This list is accurate as of: 01/31/14 10:21 AM.  Always use your most recent med list.               acetaminophen 500 MG tablet  Commonly known as:  TYLENOL  Take 500-1,000 mg by mouth every 6 (six) hours as needed for moderate pain (2 tablets for severe pain).     clopidogrel 75 MG tablet  Commonly known as:  PLAVIX  Take 75 mg by mouth daily.     DECUBI-VITE Caps    Take 2 capsules by mouth daily.     dorzolamide 2 % ophthalmic solution  Commonly known as:  TRUSOPT  Place 1 drop into both eyes 2 (two) times daily.     DSS 100 MG Caps  Take 100 mg by mouth 2 (two) times daily.     HYDROcodone-acetaminophen 5-325 MG per tablet  Commonly known as:  NORCO/VICODIN  Take 1 tablet by mouth every 4 (four) hours as needed for moderate pain.     latanoprost 0.005 % ophthalmic solution  Commonly known as:  XALATAN  Place 1 drop into both eyes at bedtime.     LORazepam 1 MG tablet  Commonly known as:  ATIVAN  Take 0.5 mg by mouth 2 (two) times daily as needed for anxiety.     LORazepam 0.5 MG tablet  Commonly known as:  ATIVAN  Take one tablet by mouth twice daily as needed for anxiety     metoprolol succinate 25 MG 24 hr tablet  Commonly known as:  TOPROL-XL  Take 12.5 mg by mouth daily.     mirtazapine 7.5 MG  tablet  Commonly known as:  REMERON  Take 7.5 mg by mouth at bedtime.     polyethylene glycol packet  Commonly known as:  MIRALAX / GLYCOLAX  Take 17 g by mouth daily as needed for moderate constipation.     QUEtiapine 100 MG tablet  Commonly known as:  SEROQUEL  Take 100 mg by mouth 2 (two) times daily.     sertraline 50 MG tablet  Commonly known as:  ZOLOFT  Take 50 mg by mouth at bedtime.     sodium chloride 5 % ophthalmic ointment  Commonly known as:  MURO 128  Place 1 application into both eyes every 6 (six) hours as needed for irritation.     tamsulosin 0.4 MG Caps capsule  Commonly known as:  FLOMAX  Take 1 capsule (0.4 mg total) by mouth daily.        Physical Exam Filed Vitals:   01/31/14 1011  BP: 109/58  Pulse: 72  Temp: 97.2 F (36.2 C)  Resp: 16   Constitutional: frail elderly female in no acute distress.  HEENT: Normocephalic and atraumatic. PERRL. EOM intact. No icterus.  Cardiac: Normal S1, S2. RRR without appreciable murmurs, rubs, or gallops. Intact pulses intact. No dependent edema.  Lungs: No  respiratory distress. Breath sounds clear bilaterally without rales, rhonchi, or wheezes. Abdomen: Audible bowel sounds in all quadrants. Soft, nontender, nondistended.   Musculoskeletal: L hand contracture noted. Seen in wheelchair Skin: Warm and dry. No rash noted. No erythema.  Neurological: Alert  Assessment & Plan  1. Rash and nonspecific skin eruption No rash noted on examination. Will continue to monitor for any changes in skin. Continue PU prophylaxis.   Family/Staff Communication Plan of care discuss with nursing staff. Nursing staff verbalize understanding and agree with plan of care. No additional questions or concerns reported.    Loura BackKim Nguyen, MSN, AGNP-C [facility] [address] [place], [place] [address] [phone] [8am-5pm] After hours: [phone]  I have personally reviewed this note and agree with the care plan  [facility] LLC[facility]HIMA Edilson Vital, MD  [facility] [phone] (Monday-Friday 8 am - 5 pm) [phone] (afterhours)

## 2014-02-20 ENCOUNTER — Ambulatory Visit
Admission: RE | Admit: 2014-02-20 | Discharge: 2014-02-20 | Disposition: A | Discharge status: Home / Self Care | Payer: Medicare Other | Source: Ambulatory Visit | Attending: Endocrinology | Admitting: Endocrinology

## 2014-02-20 DIAGNOSIS — E041 Nontoxic single thyroid nodule: Secondary | ICD-10-CM

## 2014-02-25 ENCOUNTER — Other Ambulatory Visit: Payer: Medicare (Managed Care)

## 2014-02-25 ENCOUNTER — Other Ambulatory Visit: Payer: Self-pay | Admitting: Endocrinology

## 2014-02-25 DIAGNOSIS — E041 Nontoxic single thyroid nodule: Secondary | ICD-10-CM

## 2014-03-20 ENCOUNTER — Encounter: Payer: Self-pay | Admitting: Adult Health

## 2014-03-20 ENCOUNTER — Non-Acute Institutional Stay (SKILLED_NURSING_FACILITY): Payer: Medicare (Managed Care) | Admitting: Adult Health

## 2014-03-20 DIAGNOSIS — N183 Chronic kidney disease, stage 3 unspecified: Secondary | ICD-10-CM

## 2014-03-20 DIAGNOSIS — H409 Unspecified glaucoma: Secondary | ICD-10-CM

## 2014-03-20 DIAGNOSIS — I10 Essential (primary) hypertension: Secondary | ICD-10-CM

## 2014-03-20 DIAGNOSIS — F0151 Vascular dementia with behavioral disturbance: Secondary | ICD-10-CM

## 2014-03-20 DIAGNOSIS — F01518 Vascular dementia, unspecified severity, with other behavioral disturbance: Secondary | ICD-10-CM

## 2014-03-20 DIAGNOSIS — I639 Cerebral infarction, unspecified: Secondary | ICD-10-CM

## 2014-03-20 DIAGNOSIS — E039 Hypothyroidism, unspecified: Secondary | ICD-10-CM

## 2014-03-20 NOTE — Progress Notes (Signed)
Patient ID: Jacqueline Mccann, female   DOB: 01/08/1920, 80 y.o.   MRN: [id number]   Jacqueline Mccann living [place]     No Known Allergies     Chief Complaint  Patient presents with  . Medical Management of Chronic Issues    HPI:  She is a long term resident of this facility being seen for the management of her chronic illnesses.  She is unable to fully participate in the hpi or ros. There are no nursing concerns being voiced at this time.    Past Medical History  Diagnosis Date  . Hypertension   . Dementia   . Stroke   . Hyperlipidemia   . Renal disorder   . Renal failure   . Breast cancer in situ   . Nephrolithiasis   . Thyroid nodule     Past Surgical History  Procedure Laterality Date  . Femur im nail Right 01/25/2013    Procedure: INTRAMEDULLARY (IM) NAIL FEMORAL;  Surgeon: Sheral Apleyimothy D Murphy, MD;  Location: [facility];  Service: Orthopedics;  Laterality: Right;    VITAL SIGNS BP 106/68 mmHg  Pulse 78  Ht 5\' 5"  (1.651 m)  Wt 91 lb (41.277 kg)  BMI 15.14 kg/m2   Outpatient Encounter Prescriptions as of 03/20/2014  Medication Sig  . acetaminophen (TYLENOL) 500 MG tablet Take 500 mg by mouth every 6 (six) hours as needed for moderate pain (2 tablets for severe pain).   Marland Kitchen. clopidogrel (PLAVIX) 75 MG tablet Take 75 mg by mouth daily.  Marland Kitchen. docusate sodium 100 MG CAPS Take 100 mg by mouth 2 (two) times daily.  . dorzolamide (TRUSOPT) 2 % ophthalmic solution Place 1 drop into both eyes 2 (two) times daily.  Marland Kitchen. latanoprost (XALATAN) 0.005 % ophthalmic solution Place 1 drop into both eyes at bedtime.   Marland Kitchen. LORazepam (ATIVAN) 1 MG tablet Take 0.5 mg by mouth 2 (two) times daily as needed for anxiety.   . metoprolol succinate (TOPROL-XL) 25 MG 24 hr tablet Take 12.5 mg by mouth daily.   . mirtazapine (REMERON) 7.5 MG tablet Take 7.5 mg by mouth at bedtime.  . Multiple Vitamins-Minerals (DECUBI-VITE) CAPS Take 2 capsules by mouth daily.  . polyethylene glycol (MIRALAX / GLYCOLAX)  packet Take 17 g by mouth daily as needed for moderate constipation.  . QUEtiapine (SEROQUEL) 100 MG tablet Take 100 mg by mouth 2 (two) times daily.  . sertraline (ZOLOFT) 50 MG tablet Take 50 mg by mouth at bedtime.  . sodium chloride (MURO 128) 5 % ophthalmic ointment Place 1 application into both eyes every 6 (six) hours as needed for irritation.  . tamsulosin (FLOMAX) 0.4 MG CAPS capsule Take 1 capsule (0.4 mg total) by mouth daily.  . [DISCONTINUED] HYDROcodone-acetaminophen (NORCO/VICODIN) 5-325 MG per tablet Take 1 tablet by mouth every 4 (four) hours as needed for moderate pain.  . [DISCONTINUED] LORazepam (ATIVAN) 0.5 MG tablet Take one tablet by mouth twice daily as needed for anxiety (Patient not taking: Reported on 03/20/2014)     SIGNIFICANT DIAGNOSTIC EXAMS  LABS REVIEWED;   12-28-13: wbc 5.1 hgb 10.0; hct 29.1; mcv 97.8; plt 148; glucose 85; bun 22; creat 1.2; k+4.3; na++ 143; liver normal albumin 3.0; chol 183; ldl 114; trig 75 tsh 0.616     Review of Systems  Unable to perform ROS    Physical Exam  Constitutional: No distress.  Frail   Neck: Neck supple. No JVD present.  Cardiovascular: Normal rate, regular rhythm and intact distal pulses.  Respiratory: Effort normal and breath sounds normal. No respiratory distress.  GI: Soft. Bowel sounds are normal. She exhibits no distension. There is no tenderness.  Musculoskeletal: She exhibits no edema.  Is able to move extremities; has left hand splint   Neurological: She is alert.  Skin: Skin is warm and dry. She is not diaphoretic.       ASSESSMENT/ PLAN:  1.  Hypertension: will continue toprol xl 12.5 mg daily;   2. Hallucinations and delusions: she does yell out about stealing from her on a frequent basis; will continue seroquel 100 mg twice daily   3. Anxiety and depression: will continue ativan 0.5 mg twice daily as needed for anxiety will continue zoloft 50 mg daily and will monitor   4. UI: will stop the  flomax at this time; is doubtful she is receiving benefit from this medication  5. Constipation: will continue colace twice daily and miralax daily as needed   6. Vascular dementia: she is without recent significant change; is taking remeron 7.5 mg nightly for appetite her current weight is 91 pounds; will not make changes will monitor her status.   7. Glaucoma: will continue trusopt to both eyes twice daily; xalatan to both eyes nightly;   8. Hypothyroidism: is presently not on medications; her tsh is 0.616; will not make changes will monitor   9. CVA: she is neurologically without change; will continue plavix 75 mg daily   10. CKD stage III: her current bun 22; creat 1.2. Will not make changes will monitor     Synthia Innocenteborah Fahd Galea NP [facility]  Contact [phone] Monday through Friday 8am- 5pm  After hours call [phone]

## 2014-04-06 ENCOUNTER — Encounter: Payer: Self-pay | Admitting: Adult Health

## 2014-04-06 DIAGNOSIS — I639 Cerebral infarction, unspecified: Secondary | ICD-10-CM | POA: Insufficient documentation

## 2014-04-29 ENCOUNTER — Non-Acute Institutional Stay (SKILLED_NURSING_FACILITY): Payer: Medicare (Managed Care) | Admitting: Adult Health

## 2014-04-29 DIAGNOSIS — F0151 Vascular dementia with behavioral disturbance: Secondary | ICD-10-CM

## 2014-04-29 DIAGNOSIS — N183 Chronic kidney disease, stage 3 unspecified: Secondary | ICD-10-CM

## 2014-04-29 DIAGNOSIS — E039 Hypothyroidism, unspecified: Secondary | ICD-10-CM

## 2014-04-29 DIAGNOSIS — H409 Unspecified glaucoma: Secondary | ICD-10-CM

## 2014-04-29 DIAGNOSIS — I639 Cerebral infarction, unspecified: Secondary | ICD-10-CM

## 2014-04-29 DIAGNOSIS — I10 Essential (primary) hypertension: Secondary | ICD-10-CM

## 2014-04-29 DIAGNOSIS — F01518 Vascular dementia, unspecified severity, with other behavioral disturbance: Secondary | ICD-10-CM

## 2014-05-04 ENCOUNTER — Encounter: Payer: Self-pay | Admitting: Internal Medicine

## 2014-05-28 ENCOUNTER — Encounter: Payer: Self-pay | Admitting: Adult Health

## 2014-05-28 NOTE — Progress Notes (Signed)
Patient ID: Jacqueline Mccann, female   DOB: 06/30/1919, 81 y.o.   MRN: [id number]  [facility] [place]     No Known Allergies     Chief Complaint  Patient presents with  . Medical Management of Chronic Issues    HPI:  She is a long term resident of this facility being seen for the management of her chronic illnesses. Overall there is little change in her status. She is unable to participate in the hpi or ros. There are no nursing concerns at this time.    Past Medical History  Diagnosis Date  . Hypertension   . Dementia   . Stroke   . Hyperlipidemia   . Renal disorder   . Renal failure   . Breast cancer in situ   . Nephrolithiasis   . Thyroid nodule   . Fracture of hip, right, closed 01/24/2013  . Venous stasis dermatitis 12/25/2013  . Senile osteoporosis 12/25/2013  . Chronic kidney disease, stage III (moderate) 12/25/2013    Past Surgical History  Procedure Laterality Date  . Femur im nail Right 01/25/2013    Procedure: INTRAMEDULLARY (IM) NAIL FEMORAL;  Surgeon: Jacqueline Apleyimothy D Murphy, MD;  Location: [facility] OR;  Service: Orthopedics;  Laterality: Right;    VITAL SIGNS BP 110/55 mmHg  Pulse 73  Ht 5\' 5"  (1.651 m)  Wt 99 lb (44.906 kg)  BMI 16.47 kg/m2   Outpatient Encounter Prescriptions as of 04/29/2014  Medication Sig  . acetaminophen (TYLENOL) 500 MG tablet Take 500 mg by mouth every 6 (six) hours as needed for moderate pain (2 tablets for severe pain).   Marland Kitchen. clopidogrel (PLAVIX) 75 MG tablet Take 75 mg by mouth daily.  Marland Kitchen. docusate sodium 100 MG CAPS Take 100 mg by mouth 2 (two) times daily.  . dorzolamide (TRUSOPT) 2 % ophthalmic solution Place 1 drop into both eyes 2 (two) times daily.  Marland Kitchen. latanoprost (XALATAN) 0.005 % ophthalmic solution Place 1 drop into both eyes at bedtime.   Marland Kitchen. LORazepam (ATIVAN) 1 MG tablet Take 0.5 mg by mouth 2 (two) times daily as needed for anxiety.   . metoprolol succinate (TOPROL-XL) 25 MG 24 hr tablet Take 12.5 mg by mouth  daily.   . mirtazapine (REMERON) 7.5 MG tablet Take 7.5 mg by mouth at bedtime.  . Multiple Vitamins-Minerals (DECUBI-VITE) CAPS Take 2 capsules by mouth daily.  . polyethylene glycol (MIRALAX / GLYCOLAX) packet Take 17 g by mouth daily.   . QUEtiapine (SEROQUEL) 100 MG tablet Take 100 mg by mouth 2 (two) times daily.  . sertraline (ZOLOFT) 50 MG tablet Take 50 mg by mouth at bedtime.  . sodium chloride (MURO 128) 5 % ophthalmic ointment Place 1 application into both eyes every 6 (six) hours as needed for irritation.     SIGNIFICANT DIAGNOSTIC EXAMS  03-13-14: left hand x-ray: fingers curled; overlapped and deformed with degenerative changes. No gross fracture.  03-13-14: left wrist x-ray: osteopenia; degenerative changes.    LABS REVIEWED;   12-28-13: wbc 5.1 hgb 10.0; hct 29.1; mcv 97.8; plt 148; glucose 85; bun 22; creat 1.2; k+4.3; na++ 143; liver normal albumin 3.0; chol 183; ldl 114; trig 75 tsh 0.616      Review of Systems  Unable to perform ROS    Physical Exam Constitutional: No distress.  Frail   Neck: Neck supple. No JVD present.  Cardiovascular: Normal rate, regular rhythm and intact distal pulses.   Respiratory: Effort normal and breath sounds normal. No respiratory distress.  GI: Soft. Bowel sounds are normal. She exhibits no distension. There is no tenderness.  Musculoskeletal: She exhibits no edema.  Is able to move extremities; has left hand splint   Neurological: She is alert.  Skin: Skin is warm and dry. She is not diaphoretic.     ASSESSMENT/ PLAN:  1.  Hypertension: will continue toprol xl 12.5 mg daily;   2. Hallucinations and delusions: she does yell out about people stealing from her on a frequent basis; will continue seroquel 100 mg twice daily   3. Anxiety and depression: will continue ativan 0.5 mg twice daily as needed for anxiety;  will continue zoloft 50 mg daily and will monitor   4. UI: no change in status is presently not on  medications; will monitor   5. Constipation: will continue colace twice daily and miralax daily   6. Vascular dementia: she is without recent significant change; is taking remeron 7.5 mg nightly for appetite her current weight is 99 pounds; will not make changes will monitor her status.   7. Glaucoma: will continue trusopt to both eyes twice daily; xalatan to both eyes nightly;   8. Hypothyroidism: is presently not on medications; her tsh is 0.616; will not make changes will monitor   9. CVA: she is neurologically without change; will continue plavix 75 mg daily   10. CKD stage III: her current bun 22; creat 1.2. Will not make changes will monitor      Synthia Innocent NP [facility]  Contact [phone] Monday through Friday 8am- 5pm  After hours call [phone]

## 2014-06-21 ENCOUNTER — Non-Acute Institutional Stay (SKILLED_NURSING_FACILITY): Payer: Medicare (Managed Care) | Admitting: Adult Health

## 2014-06-21 DIAGNOSIS — R443 Hallucinations, unspecified: Secondary | ICD-10-CM

## 2014-06-21 DIAGNOSIS — N183 Chronic kidney disease, stage 3 unspecified: Secondary | ICD-10-CM

## 2014-06-21 DIAGNOSIS — F418 Other specified anxiety disorders: Secondary | ICD-10-CM

## 2014-06-21 DIAGNOSIS — H409 Unspecified glaucoma: Secondary | ICD-10-CM | POA: Diagnosis not present

## 2014-06-21 DIAGNOSIS — F01518 Vascular dementia, unspecified severity, with other behavioral disturbance: Secondary | ICD-10-CM

## 2014-06-21 DIAGNOSIS — I10 Essential (primary) hypertension: Secondary | ICD-10-CM | POA: Diagnosis not present

## 2014-06-21 DIAGNOSIS — I639 Cerebral infarction, unspecified: Secondary | ICD-10-CM

## 2014-06-21 DIAGNOSIS — E039 Hypothyroidism, unspecified: Secondary | ICD-10-CM | POA: Diagnosis not present

## 2014-06-21 DIAGNOSIS — F0151 Vascular dementia with behavioral disturbance: Secondary | ICD-10-CM

## 2014-08-05 ENCOUNTER — Non-Acute Institutional Stay (SKILLED_NURSING_FACILITY): Payer: Medicare (Managed Care) | Admitting: Adult Health

## 2014-08-05 DIAGNOSIS — E039 Hypothyroidism, unspecified: Secondary | ICD-10-CM

## 2014-08-05 DIAGNOSIS — I10 Essential (primary) hypertension: Secondary | ICD-10-CM | POA: Diagnosis not present

## 2014-08-05 DIAGNOSIS — F0151 Vascular dementia with behavioral disturbance: Secondary | ICD-10-CM | POA: Diagnosis not present

## 2014-08-05 DIAGNOSIS — N390 Urinary tract infection, site not specified: Secondary | ICD-10-CM | POA: Diagnosis not present

## 2014-08-05 DIAGNOSIS — F418 Other specified anxiety disorders: Secondary | ICD-10-CM | POA: Diagnosis not present

## 2014-08-05 DIAGNOSIS — R443 Hallucinations, unspecified: Secondary | ICD-10-CM | POA: Diagnosis not present

## 2014-08-05 DIAGNOSIS — I639 Cerebral infarction, unspecified: Secondary | ICD-10-CM

## 2014-08-05 DIAGNOSIS — F01518 Vascular dementia, unspecified severity, with other behavioral disturbance: Secondary | ICD-10-CM

## 2014-08-05 DIAGNOSIS — N183 Chronic kidney disease, stage 3 unspecified: Secondary | ICD-10-CM

## 2014-08-05 DIAGNOSIS — H409 Unspecified glaucoma: Secondary | ICD-10-CM | POA: Diagnosis not present

## 2014-09-07 ENCOUNTER — Encounter: Payer: Self-pay | Admitting: Adult Health

## 2014-09-07 DIAGNOSIS — R443 Hallucinations, unspecified: Secondary | ICD-10-CM | POA: Insufficient documentation

## 2014-09-07 DIAGNOSIS — F418 Other specified anxiety disorders: Secondary | ICD-10-CM | POA: Insufficient documentation

## 2014-09-07 NOTE — Progress Notes (Signed)
Patient ID: Jacqueline Mccann, female   DOB: 10/11/1919, 81 y.o.   MRN: [id number]  [name] [place]     No Known Allergies     Chief Complaint  Patient presents with  . Medical Management of Chronic Issues    HPI:  She is a long term resident of this facility being seen for the management of her chronic illnesses. Overall her status remains stable. She is not voicing any concerns or complaints. There are no nursing concerns being voiced today.    Past Medical History  Diagnosis Date  . Hypertension   . Dementia   . Stroke   . Hyperlipidemia   . Renal disorder   . Renal failure   . Breast cancer in situ   . Nephrolithiasis   . Thyroid nodule   . Fracture of hip, right, closed 01/24/2013  . Venous stasis dermatitis 12/25/2013  . Senile osteoporosis 12/25/2013  . Chronic kidney disease, stage III (moderate) 12/25/2013    Past Surgical History  Procedure Laterality Date  . Femur im nail Right 01/25/2013    Procedure: INTRAMEDULLARY (IM) NAIL FEMORAL;  Surgeon: Sheral Apleyimothy D Murphy, MD;  Location: [facility] OR;  Service: Orthopedics;  Laterality: Right;    VITAL SIGNS BP 114/64 mmHg  Pulse 78  Ht 5\' 5"  (1.651 m)  Wt 100 lb (45.36 kg)  BMI 16.64 kg/m2   Outpatient Encounter Prescriptions as of 06/21/2014  Medication Sig  . acetaminophen (TYLENOL) 500 MG tablet Take 500 mg by mouth every 6 (six) hours as needed for moderate pain (2 tablets for severe pain).   . baclofen (LIORESAL) 10 MG tablet Take 5 mg by mouth 3 (three) times daily as needed for muscle spasms.  . clopidogrel (PLAVIX) 75 MG tablet Take 75 mg by mouth daily.  Marland Kitchen. docusate sodium 100 MG CAPS Take 100 mg by mouth 2 (two) times daily.  . dorzolamide (TRUSOPT) 2 % ophthalmic solution Place 1 drop into both eyes 2 (two) times daily.  Marland Kitchen. latanoprost (XALATAN) 0.005 % ophthalmic solution Place 1 drop into both eyes at bedtime.   Marland Kitchen. LORazepam (ATIVAN) 2 MG/ML concentrated solution Take 0.75 mg by mouth 2 (two)  times daily as needed for anxiety.  . metoprolol tartrate (LOPRESSOR) 25 MG tablet Take 12.5 mg by mouth 2 (two) times daily.  . mirtazapine (REMERON) 7.5 MG tablet Take 7.5 mg by mouth at bedtime.  . Multiple Vitamins-Minerals (DECUBI-VITE) CAPS Take 2 capsules by mouth daily.  . polyethylene glycol (MIRALAX / GLYCOLAX) packet Take 17 g by mouth daily.   . QUEtiapine (SEROQUEL) 100 MG tablet Take 100 mg by mouth 2 (two) times daily.  . sertraline (ZOLOFT) 50 MG tablet Take 50 mg by mouth at bedtime.  . sodium chloride (MURO 128) 5 % ophthalmic ointment Place 1 application into both eyes every 6 (six) hours as needed for irritation.      SIGNIFICANT DIAGNOSTIC EXAMS   03-13-14: left hand x-ray: fingers curled; overlapped and deformed with degenerative changes. No gross fracture.  03-13-14: left wrist x-ray: osteopenia; degenerative changes.    LABS REVIEWED;   12-28-13: wbc 5.1 hgb 10.0; hct 29.1; mcv 97.8; plt 148; glucose 85; bun 22; creat 1.2; k+4.3; na++ 143; liver normal albumin 3.0; chol 183; ldl 114; trig 75 tsh 0.616      ROS Unable to perform ROS    Physical Exam Constitutional: No distress.  Frail   Neck: Neck supple. No JVD present.  Cardiovascular: Normal rate, regular rhythm and intact  distal pulses.   Respiratory: Effort normal and breath sounds normal. No respiratory distress.  GI: Soft. Bowel sounds are normal. She exhibits no distension. There is no tenderness.  Musculoskeletal: She exhibits no edema.  Is able to move extremities; has left hand splint   Neurological: She is alert.  Skin: Skin is warm and dry. She is not diaphoretic.        ASSESSMENT/ PLAN:  1.  Hypertension: will continue lopressor 12.5 mg twice daily   2. Hallucinations and delusions: she does yell out about people stealing from her on a frequent basis; will continue seroquel 100 mg twice daily   3. Anxiety and depression: will continue ativan 0.75 mg twice daily as needed for  anxiety;  will continue zoloft 50 mg daily and will monitor   4. UI: no change in status is presently not on medications; will monitor   5. Constipation: will continue colace twice daily and miralax daily   6. Vascular dementia: she is without recent significant change; is taking remeron 7.5 mg nightly for appetite her current weight is 100 pounds; will not make changes will monitor her status.   7. Glaucoma: will continue trusopt to both eyes twice daily; xalatan to both eyes nightly;   8. Hypothyroidism: is presently not on medications; her tsh is 0.616; will not make changes will monitor   9. CVA: she is neurologically without change; will continue plavix 75 mg daily   10. CKD stage III: her current bun 22; creat 1.2. Will not make changes will monitor     Will check cbc and cmp    Synthia Innocent NP [facility]  Contact [phone] Monday through Friday 8am- 5pm  After hours call [phone]

## 2014-09-20 ENCOUNTER — Non-Acute Institutional Stay (SKILLED_NURSING_FACILITY): Payer: Medicare Other | Admitting: Adult Health

## 2014-09-20 DIAGNOSIS — N183 Chronic kidney disease, stage 3 unspecified: Secondary | ICD-10-CM

## 2014-09-20 DIAGNOSIS — I639 Cerebral infarction, unspecified: Secondary | ICD-10-CM

## 2014-09-20 DIAGNOSIS — I10 Essential (primary) hypertension: Secondary | ICD-10-CM | POA: Diagnosis not present

## 2014-09-20 DIAGNOSIS — H409 Unspecified glaucoma: Secondary | ICD-10-CM

## 2014-09-20 DIAGNOSIS — F0151 Vascular dementia with behavioral disturbance: Secondary | ICD-10-CM | POA: Diagnosis not present

## 2014-09-20 DIAGNOSIS — F418 Other specified anxiety disorders: Secondary | ICD-10-CM

## 2014-09-20 DIAGNOSIS — R443 Hallucinations, unspecified: Secondary | ICD-10-CM | POA: Diagnosis not present

## 2014-09-20 DIAGNOSIS — F01518 Vascular dementia, unspecified severity, with other behavioral disturbance: Secondary | ICD-10-CM

## 2014-09-20 DIAGNOSIS — E039 Hypothyroidism, unspecified: Secondary | ICD-10-CM | POA: Diagnosis not present

## 2014-10-08 NOTE — Progress Notes (Signed)
Patient ID: Jacqueline Mccann, female   DOB: 1920-01-19, 81 y.o.   MRN: [id number]  Jacqueline Mccann living [place]     No Known Allergies     Chief Complaint  Patient presents with  . Medical Management of Chronic Issues    HPI:  She is a long term resident of this facility being seen for the management of her chronic illnesses. overall there is little change in her status. She tells me that she is feeling good. She is unable to fully participate in the hpi or ros. There are no nursing concerns at this time.    Past Medical History  Diagnosis Date  . Hypertension   . Dementia   . Stroke   . Hyperlipidemia   . Renal disorder   . Renal failure   . Breast cancer in situ   . Nephrolithiasis   . Thyroid nodule   . Fracture of hip, right, closed 01/24/2013  . Venous stasis dermatitis 12/25/2013  . Senile osteoporosis 12/25/2013  . Chronic kidney disease, stage III (moderate) 12/25/2013    Past Surgical History  Procedure Laterality Date  . Femur im nail Right 01/25/2013    Procedure: INTRAMEDULLARY (IM) NAIL FEMORAL;  Surgeon: Sheral Apley, MD;  Location: [facility] OR;  Service: Orthopedics;  Laterality: Right;    VITAL SIGNS BP 140/64 mmHg  Pulse 61  Ht  (1.651 m)  Wt 107 lb (48.535 kg)  BMI 17.81 kg/m2   Outpatient Encounter Prescriptions as of 08/05/2014  Medication Sig  . acetaminophen (TYLENOL) 500 MG tablet Take 500 mg by mouth every 6 (six) hours as needed for moderate pain (2 tablets for severe pain).   . baclofen (LIORESAL) 10 MG tablet Take 5 mg by mouth 3 (three) times daily as needed for muscle spasms.  . clopidogrel (PLAVIX) 75 MG tablet Take 75 mg by mouth daily.  Marland Kitchen docusate sodium 100 MG CAPS Take 100 mg by mouth 2 (two) times daily.  . dorzolamide (TRUSOPT) 2 % ophthalmic solution Place 1 drop into both eyes 2 (two) times daily.  Marland Kitchen latanoprost (XALATAN) 0.005 % ophthalmic solution Place 1 drop into both eyes at bedtime.   Marland Kitchen LORazepam (ATIVAN) 2 MG/ML  concentrated solution Take 0.75 mg by mouth 2 (two) times daily as needed for anxiety.  . metoprolol tartrate (LOPRESSOR) 25 MG tablet Take 12.5 mg by mouth 2 (two) times daily.  . mirtazapine (REMERON) 7.5 MG tablet Take 7.5 mg by mouth at bedtime.  . Multiple Vitamins-Minerals (DECUBI-VITE) CAPS Take 2 capsules by mouth daily.  . polyethylene glycol (MIRALAX / GLYCOLAX) packet Take 17 g by mouth daily.   . QUEtiapine (SEROQUEL) 100 MG tablet Take 100 mg by mouth 2 (two) times daily.  . sertraline (ZOLOFT) 50 MG tablet Take 50 mg by mouth at bedtime.  . sodium chloride (MURO 128) 5 % ophthalmic ointment Place 1 application into both eyes every 6 (six) hours as needed for irritation.      SIGNIFICANT DIAGNOSTIC EXAMS  03-13-14: left hand x-ray: fingers curled; overlapped and deformed with degenerative changes. No gross fracture.  03-13-14: left wrist x-ray: osteopenia; degenerative changes.    LABS REVIEWED;   12-28-13: wbc 5.1 hgb 10.0; hct 29.1; mcv 97.8; plt 148; glucose 85; bun 22; creat 1.2; k+4.3;  na++ 143; liver normal albumin 3.0; chol 183; ldl 114; trig 75 tsh 0.616  06-24-14: wbc 5.3; hgb 10.5; hct 31.9; mcv 97 plt 196; glucose 77; bun 45; creat 1.40; k+4.8; na++142; liver normal  albumin 3.0        ROS Unable to perform ROS   Physical Exam Constitutional: No distress.  Frail   Neck: Neck supple. No JVD present.  Cardiovascular: Normal rate, regular rhythm and intact distal pulses.   Respiratory: Effort normal and breath sounds normal. No respiratory distress.  GI: Soft. Bowel sounds are normal. She exhibits no distension. There is no tenderness.  Musculoskeletal: She exhibits no edema.  Is able to move extremities; has left hand splint   Neurological: She is alert.  Skin: Skin is warm and dry. She is not diaphoretic.        ASSESSMENT/ PLAN:  1.  Hypertension: will continue lopressor 12.5 mg twice daily   2. Hallucinations and delusions: she does yell out  about people stealing from her on a frequent basis; will continue seroquel 100 mg twice daily   3. Anxiety and depression: will continue ativan 0.75 mg twice daily as needed for anxiety;  will continue zoloft 50 mg daily and will monitor   4. UI: no change in status is presently not on medications; will monitor   5. Constipation: will continue colace twice daily and miralax daily   6. Vascular dementia: she is without recent significant change; is taking remeron 7.5 mg nightly for appetite her current weight is 107 pounds; will not make changes will monitor her status.   7. Glaucoma: will continue trusopt to both eyes twice daily; xalatan to both eyes nightly;   8. Hypothyroidism: is presently not on medications; her tsh is 0.616; will not make changes will monitor   9. CVA: she is neurologically without change; will continue plavix 75 mg daily   10. CKD stage III: her current bun 45; creat 1.4. Will not make changes will monitor       Synthia Innocenteborah Green NP [facility]  Contact [phone] Monday through Friday 8am- 5pm  After hours call [phone]

## 2014-10-22 ENCOUNTER — Non-Acute Institutional Stay (SKILLED_NURSING_FACILITY): Payer: Medicare Other | Admitting: Internal Medicine

## 2014-10-22 DIAGNOSIS — N183 Chronic kidney disease, stage 3 unspecified: Secondary | ICD-10-CM

## 2014-10-22 DIAGNOSIS — I10 Essential (primary) hypertension: Secondary | ICD-10-CM

## 2014-10-22 DIAGNOSIS — Z8673 Personal history of transient ischemic attack (TIA), and cerebral infarction without residual deficits: Secondary | ICD-10-CM

## 2014-10-22 DIAGNOSIS — F418 Other specified anxiety disorders: Secondary | ICD-10-CM | POA: Diagnosis not present

## 2014-10-22 DIAGNOSIS — F01518 Vascular dementia, unspecified severity, with other behavioral disturbance: Secondary | ICD-10-CM

## 2014-10-22 DIAGNOSIS — E039 Hypothyroidism, unspecified: Secondary | ICD-10-CM | POA: Diagnosis not present

## 2014-10-22 DIAGNOSIS — F0151 Vascular dementia with behavioral disturbance: Secondary | ICD-10-CM

## 2014-10-24 ENCOUNTER — Encounter: Payer: Self-pay | Admitting: Internal Medicine

## 2014-10-24 NOTE — Progress Notes (Signed)
Patient ID: Jacqueline Mccann, female   DOB: 25-Nov-1919, 81 y.o.   MRN: [id number]    DATE: 10/22/14  Location:  [facility]    Place of Service: SNF [phone])   Extended Emergency Contact Information Primary Emergency Contact: [place] Address: [address]          [place], [place] [address] [place] Home Phone: [phone] Relation: Son Secondary Emergency Contact: Melburn Popper Address: [address]          [place] [address] [place] of [place] Home Phone: [phone] Mobile Phone: [phone] Relation: Daughter  Advanced Directive information  FULL CODE  Chief Complaint  Patient presents with  . Medical Management of Chronic Issues    HPI:  81 yo female long term resident seen today for f/u. She has no concerns today   Hypertension - continue lopressor. BP 130-140s/70s usually  Hallucinations and delusions - stable overall on seroquel  Anxiety and depression - stable on ativan and zoloft   Constipation - stable on colace and miralax   Vascular dementia - stable. Takes remeron for appetite  Hypothyroidism - stable off medications. Last TSH 0.616  Hx CVA - stable on plavix  CKD stage III - stable with last creat 1.4.   Past Medical History  Diagnosis Date  . Hypertension   . Dementia   . Stroke   . Hyperlipidemia   . Renal disorder   . Renal failure   . Breast cancer in situ   . Nephrolithiasis   . Thyroid nodule   . Fracture of hip, right, closed 01/24/2013  . Venous stasis dermatitis 12/25/2013  . Senile osteoporosis 12/25/2013  . Chronic kidney disease, stage III (moderate) 12/25/2013    Past Surgical History  Procedure Laterality Date  . Femur im nail Right 01/25/2013    Procedure: INTRAMEDULLARY (IM) NAIL FEMORAL;  Surgeon: Sheral Apley, MD;  Location: [facility] OR;  Service: Orthopedics;  Laterality: Right;    Patient Care Team: Kirt Boys, DO as PCP - General (Internal Medicine)  History     Social History  . Marital Status: Widowed    Spouse Name: N/A  . Number of Children: N/A  . Years of Education: N/A   Occupational History  . Not on file.   Social History Main Topics  . Smoking status: Unknown If Ever Smoked  . Smokeless tobacco: Not on file  . Alcohol Use: Not on file  . Drug Use: Not on file  . Sexual Activity: Not on file   Other Topics Concern  . Not on file   Social History Narrative     has no tobacco, alcohol, and drug history on file.   There is no immunization history on file for this patient.  No Known Allergies  Medications: Patient's Medications  New Prescriptions   No medications on file  Previous Medications   ACETAMINOPHEN (TYLENOL) 500 MG TABLET    Take 500 mg by mouth every 6 (six) hours as needed for moderate pain (2 tablets for severe pain).    BACLOFEN (LIORESAL) 10 MG TABLET    Take 5 mg by mouth 3 (three) times daily as needed for muscle spasms.   CLOPIDOGREL (PLAVIX) 75 MG TABLET    Take 75 mg by mouth daily.   DOCUSATE SODIUM 100 MG CAPS    Take 100 mg by mouth 2 (two) times daily.   DORZOLAMIDE (TRUSOPT) 2 % OPHTHALMIC SOLUTION    Place 1 drop into both eyes 2 (  two) times daily.   LATANOPROST (XALATAN) 0.005 % OPHTHALMIC SOLUTION    Place 1 drop into both eyes at bedtime.    LORAZEPAM (ATIVAN) 2 MG/ML CONCENTRATED SOLUTION    Take 0.75 mg by mouth 2 (two) times daily as needed for anxiety.   METOPROLOL TARTRATE (LOPRESSOR) 25 MG TABLET    Take 12.5 mg by mouth 2 (two) times daily.   MIRTAZAPINE (REMERON) 7.5 MG TABLET    Take 7.5 mg by mouth at bedtime.   MULTIPLE VITAMINS-MINERALS (DECUBI-VITE) CAPS    Take 2 capsules by mouth daily.   POLYETHYLENE GLYCOL (MIRALAX / GLYCOLAX) PACKET    Take 17 g by mouth daily.    QUETIAPINE (SEROQUEL) 100 MG TABLET    Take 100 mg by mouth 2 (two) times daily.   SERTRALINE (ZOLOFT) 50 MG TABLET    Take 50 mg by mouth at bedtime.   SODIUM CHLORIDE (MURO 128) 5 % OPHTHALMIC OINTMENT    Place  1 application into both eyes every 6 (six) hours as needed for irritation.  Modified Medications   No medications on file  Discontinued Medications   No medications on file    Review of Systems  Unable to perform ROS: Dementia    Filed Vitals:   10/22/14 1258  BP: 152/80  Pulse: 68  Temp: 97.3 F (36.3 C)  Weight: 108 lb (48.988 kg)   Body mass index is 17.97 kg/(m^2).  Physical Exam  Constitutional: She appears well-developed.  Frail appearing in NAD  HENT:  Mouth/Throat: Oropharynx is clear and moist. No oropharyngeal exudate.  Eyes: Pupils are equal, round, and reactive to light. No scleral icterus.  Neck: Neck supple. Carotid bruit is not present. No tracheal deviation present. No thyromegaly present.  Cardiovascular: Normal rate, regular rhythm and intact distal pulses.  Exam reveals no gallop and no friction rub.   Murmur (1/6 SEM) heard. No LE edema b/l. no calf TTP.   Pulmonary/Chest: Effort normal and breath sounds normal. No stridor. No respiratory distress. She has no wheezes. She has no rales.  Abdominal: Soft. Bowel sounds are normal. She exhibits no distension and no mass. There is no hepatomegaly. There is no tenderness. There is no rebound and no guarding.  Lymphadenopathy:    She has no cervical adenopathy.  Neurological: She is alert.  Skin: Skin is warm and dry. No rash noted.  Psychiatric: She has a normal mood and affect. Her behavior is normal.     Labs reviewed: No visits with results within 3 Month(s) from this visit. Latest known visit with results is:  Admission on 06/30/2013, Discharged on 06/30/2013  Component Date Value Ref Range Status  . Sodium 06/30/2013 145  137 - 147 mEq/L Final  . Potassium 06/30/2013 4.4  3.7 - 5.3 mEq/L Final  . Chloride 06/30/2013 105  96 - 112 mEq/L Final  . BUN 06/30/2013 36* 6 - 23 mg/dL Final  . Creatinine, Ser 06/30/2013 1.50* 0.50 - 1.10 mg/dL Final  . Glucose, Bld 09/81/1914 130* 70 - 99 mg/dL Final  .  Calcium, Ion 06/30/2013 1.23  1.13 - 1.30 mmol/L Final  . TCO2 06/30/2013 25  0 - 100 mmol/L Final  . Hemoglobin 06/30/2013 10.9* 12.0 - 15.0 g/dL Final  . HCT 78/29/5621 32.0* 36.0 - 46.0 % Final  . Troponin i, poc 06/30/2013 0.02  0.00 - 0.08 ng/mL Final  . Comment 3 06/30/2013          Final   Comment: Due to the  release kinetics of cTnI,                          a negative result within the first hours                          of the onset of symptoms does not rule out                          myocardial infarction with certainty.                          If myocardial infarction is still suspected,                          repeat the test at appropriate intervals.    No results found.   Assessment/Plan   ICD-9-CM ICD-10-CM   1. Vascular dementia with behavioral disturbance 290.40 F01.51   2. Essential hypertension 401.9 I10   3. Hypothyroidism, unspecified hypothyroidism type 244.9 E03.9   4. History of stroke V12.54 Z86.73   5. Depression with anxiety 300.4 F41.8   6. Chronic kidney disease, stage III (moderate) 585.3 N18.3     --Pt is medically stable on current tx plan. Continue current medications as ordered. PT/OT/ST as indicated. Will follow  Cosme Jacob S. Ancil Linseyarter, D. O., F. A. C. O. I.  [facility] [address] [place], [place] [address] [phone] Cell (Monday-Friday 8 AM - 5 PM) [phone] After 5 PM and follow prompts

## 2014-11-11 ENCOUNTER — Encounter: Payer: Self-pay | Admitting: Adult Health

## 2014-11-11 NOTE — Progress Notes (Signed)
Patient ID: Jacqueline Mccann, female   DOB: 1919-09-25, 81 y.o.   MRN: [id number]    Facility: [facility]      No Known Allergies  Chief Complaint  Patient presents with  . Medical Management of Chronic Issues    HPI:  She is a long term resident of this facility being seen for the management of her chronic illnesses. She is unable to fully participate in the hpi or ros. Overall her status remains stable. There are no nursing concerns at this time   Past Medical History  Diagnosis Date  . Hypertension   . Dementia   . Stroke   . Hyperlipidemia   . Renal disorder   . Renal failure   . Breast cancer in situ   . Nephrolithiasis   . Thyroid nodule   . Fracture of hip, right, closed 01/24/2013  . Venous stasis dermatitis 12/25/2013  . Senile osteoporosis 12/25/2013  . Chronic kidney disease, stage III (moderate) 12/25/2013    Past Surgical History  Procedure Laterality Date  . Femur im nail Right 01/25/2013    Procedure: INTRAMEDULLARY (IM) NAIL FEMORAL;  Surgeon: Sheral Apley, MD;  Location: [facility] OR;  Service: Orthopedics;  Laterality: Right;    VITAL SIGNS BP 140/70 mmHg  Pulse 64  Ht  (1.651 m)  Wt 109 lb (49.442 kg)  BMI 18.14 kg/m2  Patient's Medications  New Prescriptions   No medications on file  Previous Medications   ACETAMINOPHEN (TYLENOL) 500 MG TABLET    Take 500 mg by mouth every 6 (six) hours as needed for moderate pain (2 tablets for severe pain).    BACLOFEN (LIORESAL) 10 MG TABLET    Take 5 mg by mouth 3 (three) times daily as needed for muscle spasms.   CLOPIDOGREL (PLAVIX) 75 MG TABLET    Take 75 mg by mouth daily.   DOCUSATE SODIUM 100 MG CAPS    Take 100 mg by mouth 2 (two) times daily.   DORZOLAMIDE (TRUSOPT) 2 % OPHTHALMIC SOLUTION    Place 1 drop into both eyes 2 (two) times daily.   LATANOPROST (XALATAN) 0.005 % OPHTHALMIC SOLUTION    Place 1 drop into both eyes at bedtime.    LORAZEPAM (ATIVAN) 2 MG/ML CONCENTRATED  SOLUTION    Take 0.75 mg by mouth 2 (two) times daily as needed for anxiety.   METOPROLOL TARTRATE (LOPRESSOR) 25 MG TABLET    Take 12.5 mg by mouth 2 (two) times daily.   MIRTAZAPINE (REMERON) 7.5 MG TABLET    Take 7.5 mg by mouth at bedtime.   MULTIPLE VITAMINS-MINERALS (DECUBI-VITE) CAPS    Take 2 capsules by mouth daily.   POLYETHYLENE GLYCOL (MIRALAX / GLYCOLAX) PACKET    Take 17 g by mouth daily.    QUETIAPINE (SEROQUEL) 100 MG TABLET    Take 100 mg by mouth 2 (two) times daily.   SERTRALINE (ZOLOFT) 50 MG TABLET    Take 50 mg by mouth at bedtime.   SODIUM CHLORIDE (MURO 128) 5 % OPHTHALMIC OINTMENT    Place 1 application into both eyes every 6 (six) hours as needed for irritation.  Modified Medications   No medications on file  Discontinued Medications   No medications on file     SIGNIFICANT DIAGNOSTIC EXAMS   03-13-14: left hand x-ray: fingers curled; overlapped and deformed with degenerative changes. No gross fracture.  03-13-14: left wrist x-ray: osteopenia; degenerative changes.    LABS REVIEWED;   12-28-13: wbc  5.1 hgb 10.0; hct 29.1; mcv 97.8; plt 148; glucose 85; bun 22; creat 1.2; k+4.3;  na++ 143; liver normal albumin 3.0; chol 183; ldl 114; trig 75 tsh 0.616  06-24-14: wbc 5.3; hgb 10.5; hct 31.9; mcv 97 plt 196; glucose 77; bun 45; creat 1.40; k+4.8; na++142; liver normal albumin 3.0        Review of Systems  Unable to perform ROS: Dementia      Physical Exam  Constitutional: No distress.  Eyes: Conjunctivae are normal.  Neck: Neck supple. No JVD present. No thyromegaly present.  Cardiovascular: Normal rate, regular rhythm and intact distal pulses.   Respiratory: Effort normal and breath sounds normal. No respiratory distress. She has no wheezes.  GI: Soft. Bowel sounds are normal. She exhibits no distension. There is no tenderness.  Musculoskeletal: She exhibits no edema.  Able to move all extremities  Has left hand splint   Lymphadenopathy:    She  has no cervical adenopathy.  Neurological: She is alert.  Skin: Skin is warm and dry. She is not diaphoretic.  Psychiatric: She has a normal mood and affect.       ASSESSMENT/ PLAN:  1.  Hypertension: will continue lopressor 12.5 mg twice daily   2. Hallucinations and delusions: she does yell out about people stealing from her on a frequent basis; will continue seroquel 100 mg twice daily   3. Anxiety and depression: will continue ativan 0.75 mg twice daily as needed for anxiety;  will continue zoloft 50 mg daily and will monitor   4. UI: no change in status is presently not on medications; will monitor   5. Constipation: will continue colace twice daily and miralax daily   6. Vascular dementia: she is without recent significant change; is taking remeron 7.5 mg nightly for appetite her current weight is 107 pounds; will not make changes will monitor her status.   7. Glaucoma: will continue trusopt to both eyes twice daily; xalatan to both eyes nightly;   8. Hypothyroidism: is presently not on medications; her tsh is 0.616; will not make changes will monitor   9. CVA: she is neurologically without change; will continue plavix 75 mg daily   10. CKD stage III: her current bun 45; creat 1.4. Will not make changes will monitor     Synthia Innocent NP [facility]  Contact [phone] Monday through Friday 8am- 5pm  After hours call [phone]

## 2014-12-09 ENCOUNTER — Encounter: Payer: Self-pay | Admitting: Adult Health

## 2014-12-09 ENCOUNTER — Non-Acute Institutional Stay (SKILLED_NURSING_FACILITY): Payer: Medicare Other | Admitting: Adult Health

## 2014-12-09 DIAGNOSIS — F418 Other specified anxiety disorders: Secondary | ICD-10-CM | POA: Diagnosis not present

## 2014-12-09 DIAGNOSIS — E039 Hypothyroidism, unspecified: Secondary | ICD-10-CM

## 2014-12-09 DIAGNOSIS — H409 Unspecified glaucoma: Secondary | ICD-10-CM

## 2014-12-09 DIAGNOSIS — F01518 Vascular dementia, unspecified severity, with other behavioral disturbance: Secondary | ICD-10-CM

## 2014-12-09 DIAGNOSIS — N183 Chronic kidney disease, stage 3 unspecified: Secondary | ICD-10-CM

## 2014-12-09 DIAGNOSIS — I639 Cerebral infarction, unspecified: Secondary | ICD-10-CM

## 2014-12-09 DIAGNOSIS — I10 Essential (primary) hypertension: Secondary | ICD-10-CM

## 2014-12-09 DIAGNOSIS — F0151 Vascular dementia with behavioral disturbance: Secondary | ICD-10-CM

## 2014-12-09 DIAGNOSIS — R443 Hallucinations, unspecified: Secondary | ICD-10-CM | POA: Diagnosis not present

## 2014-12-09 NOTE — Progress Notes (Signed)
Patient ID: Jacqueline Mccann, female   DOB: 1919/05/01, 81 y.o.   MRN: [id number]   Facility: [facility]      No Known Allergies  Chief Complaint  Patient presents with  . Medical Management of Chronic Issues     HPI:  She is a long term resident of this facility being seen for the management of her chronic illnesses. Overall her status is without change. She does continue to hallucinate about work; and getting places continues on seroquel twice daily; her hallucinations make it difficult for to participate with her environment. She is unable to fully participate in the hpi or ros. There are no nursing concerns at this time.    Past Medical History  Diagnosis Date  . Hypertension   . Dementia   . Stroke   . Hyperlipidemia   . Renal disorder   . Renal failure   . Breast cancer in situ   . Nephrolithiasis   . Thyroid nodule   . Fracture of hip, right, closed 01/24/2013  . Venous stasis dermatitis 12/25/2013  . Senile osteoporosis 12/25/2013  . Chronic kidney disease, stage III (moderate) 12/25/2013    Past Surgical History  Procedure Laterality Date  . Femur im nail Right 01/25/2013    Procedure: INTRAMEDULLARY (IM) NAIL FEMORAL;  Surgeon: Sheral Apley, MD;  Location: [facility] OR;  Service: Orthopedics;  Laterality: Right;    VITAL SIGNS BP 124/88 mmHg  Pulse 74  Ht 5\' 5"  (1.651 m)  Wt 115 lb (52.164 kg)  BMI 19.14 kg/m2  Patient's Medications  New Prescriptions   No medications on file  Previous Medications   ACETAMINOPHEN (TYLENOL) 500 MG TABLET    Take 500 mg by mouth every 6 (six) hours as needed for moderate pain (2 tablets for severe pain).    BACLOFEN (LIORESAL) 10 MG TABLET    Take 5 mg by mouth 3 (three) times daily as needed for muscle spasms.   CLOPIDOGREL (PLAVIX) 75 MG TABLET    Take 75 mg by mouth daily.   DOCUSATE SODIUM 100 MG CAPS    Take 100 mg by mouth 2 (two) times daily.   DORZOLAMIDE (TRUSOPT) 2 % OPHTHALMIC SOLUTION    Place 1  drop into both eyes 2 (two) times daily.   LATANOPROST (XALATAN) 0.005 % OPHTHALMIC SOLUTION    Place 1 drop into both eyes at bedtime.    LORAZEPAM (ATIVAN) 2 MG/ML CONCENTRATED SOLUTION    Take 0.5 mg by mouth 2 (two) times daily as needed for anxiety.    METOPROLOL TARTRATE (LOPRESSOR) 25 MG TABLET    Take 12.5 mg by mouth 2 (two) times daily.   MIRTAZAPINE (REMERON) 7.5 MG TABLET    Take 7.5 mg by mouth at bedtime.   MULTIPLE VITAMINS-MINERALS (DECUBI-VITE) CAPS    Take 2 capsules by mouth daily.   POLYETHYLENE GLYCOL (MIRALAX / GLYCOLAX) PACKET    Take 17 g by mouth every other day.    QUETIAPINE (SEROQUEL) 100 MG TABLET    Take 100 mg by mouth 2 (two) times daily.   SERTRALINE (ZOLOFT) 50 MG TABLET    Take 50 mg by mouth at bedtime.   SODIUM CHLORIDE (MURO 128) 5 % OPHTHALMIC OINTMENT    Place 1 application into both eyes every 6 (six) hours as needed for irritation.  Modified Medications   No medications on file  Discontinued Medications   No medications on file     SIGNIFICANT DIAGNOSTIC EXAMS   LABS  REVIEWED;   12-28-13: wbc 5.1 hgb 10.0; hct 29.1; mcv 97.8; plt 148; glucose 85; bun 22; creat 1.2; k+4.3;  na++ 143; liver normal albumin 3.0; chol 183; ldl 114; trig 75 tsh 0.616  06-24-14: wbc 5.3; hgb 10.5; hct 31.9; mcv 97 plt 196; glucose 77; bun 45; creat 1.40; k+4.8; na++142; liver normal albumin 3.0     Review of Systems  Unable to perform ROS: Dementia      Physical Exam  Constitutional: No distress.  Frail   Eyes: Conjunctivae are normal.  Neck: Neck supple. No JVD present. No thyromegaly present.  Cardiovascular: Normal rate, regular rhythm and intact distal pulses.   Respiratory: Effort normal and breath sounds normal. No respiratory distress. She has no wheezes.  GI: Soft. Bowel sounds are normal. She exhibits no distension. There is no tenderness.  Musculoskeletal: She exhibits no edema.  Able to move all extremities  Left hand with contracture does use  splint   Lymphadenopathy:    She has no cervical adenopathy.  Neurological: She is alert.  Skin: Skin is warm and dry. She is not diaphoretic.     ASSESSMENT/ PLAN:  1.  Hypertension: will continue lopressor 12.5 mg twice daily is presently stable.   2. Hallucinations and delusions: she does have hallucinations about work and going places. These are interfering with her ability to participate with her environment; they do not cause her distress; will continue seroquel 100 mg twice daily   3. Anxiety and depression: will continue ativan 0.5 mg twice daily as needed for anxiety;  will continue zoloft 50 mg daily; she does continue to receive benefit from these medications  will monitor   4. Constipation: will continue colace twice daily and miralax every other day   5. Vascular dementia: she is without recent significant change; she is not on medications at this time; current weight is 115 pounds; will not make changes will monitor her status.   6. Glaucoma: will continue trusopt to both eyes twice daily; xalatan to both eyes nightly;   7. Hypothyroidism: is presently not on medications; her tsh is 0.616; will not make changes will monitor   8. CVA: she is neurologically without change; will continue plavix 75 mg daily   9. CKD stage III: her current bun 45; creat 1.4. Will not make changes will monitor   10. FTT: her current weight is 115 pounds; she has gained weight from 107 pounds in June of this year; per staff her appetite remains very poor; but she does take supplements; will continue her remeron 7.5 mg nightly at this time.  Her albumin is 3.0   Will check cbc; cmp; hgb a1c; tsh next blood draw     Synthia Innocent NP [facility]  Contact [phone] Monday through Friday 8am- 5pm  After hours call [phone]

## 2014-12-11 LAB — CBC AND DIFFERENTIAL
HEMATOCRIT: 36 % (ref 36–46)
HEMOGLOBIN: 12.1 g/dL (ref 12.0–16.0)
PLATELETS: 175 10*3/uL (ref 150–399)
WBC: 4.5 10^3/mL

## 2014-12-11 LAB — BASIC METABOLIC PANEL
BUN: 55 mg/dL — AB (ref 4–21)
Creatinine: 1.5 mg/dL — AB (ref 0.5–1.1)
POTASSIUM: 4.2 mmol/L (ref 3.4–5.3)
Sodium: 140 mmol/L (ref 137–147)

## 2014-12-11 LAB — HEPATIC FUNCTION PANEL
ALT: 11 U/L (ref 7–35)
AST: 23 U/L (ref 13–35)
Alkaline Phosphatase: 81 U/L (ref 25–125)
Bilirubin, Total: 0.4 mg/dL

## 2014-12-11 LAB — TSH: TSH: 0.67 u[IU]/mL (ref 0.41–5.90)

## 2014-12-11 LAB — HEMOGLOBIN A1C: Hemoglobin A1C: 6

## 2015-01-20 ENCOUNTER — Non-Acute Institutional Stay (SKILLED_NURSING_FACILITY): Payer: Medicare Other | Admitting: Adult Health

## 2015-01-20 DIAGNOSIS — F0151 Vascular dementia with behavioral disturbance: Secondary | ICD-10-CM | POA: Diagnosis not present

## 2015-01-20 DIAGNOSIS — E034 Atrophy of thyroid (acquired): Secondary | ICD-10-CM | POA: Diagnosis not present

## 2015-01-20 DIAGNOSIS — F01518 Vascular dementia, unspecified severity, with other behavioral disturbance: Secondary | ICD-10-CM

## 2015-01-20 DIAGNOSIS — I639 Cerebral infarction, unspecified: Secondary | ICD-10-CM | POA: Diagnosis not present

## 2015-01-20 DIAGNOSIS — R443 Hallucinations, unspecified: Secondary | ICD-10-CM

## 2015-01-20 DIAGNOSIS — E038 Other specified hypothyroidism: Secondary | ICD-10-CM

## 2015-01-20 DIAGNOSIS — I10 Essential (primary) hypertension: Secondary | ICD-10-CM | POA: Diagnosis not present

## 2015-01-20 DIAGNOSIS — N183 Chronic kidney disease, stage 3 unspecified: Secondary | ICD-10-CM

## 2015-01-20 DIAGNOSIS — F418 Other specified anxiety disorders: Secondary | ICD-10-CM | POA: Diagnosis not present

## 2015-01-20 DIAGNOSIS — H409 Unspecified glaucoma: Secondary | ICD-10-CM | POA: Diagnosis not present

## 2015-02-21 ENCOUNTER — Non-Acute Institutional Stay (SKILLED_NURSING_FACILITY): Payer: Medicare Other | Admitting: Adult Health

## 2015-02-21 DIAGNOSIS — I10 Essential (primary) hypertension: Secondary | ICD-10-CM | POA: Diagnosis not present

## 2015-02-21 DIAGNOSIS — F01518 Vascular dementia, unspecified severity, with other behavioral disturbance: Secondary | ICD-10-CM

## 2015-02-21 DIAGNOSIS — E038 Other specified hypothyroidism: Secondary | ICD-10-CM

## 2015-02-21 DIAGNOSIS — F0151 Vascular dementia with behavioral disturbance: Secondary | ICD-10-CM

## 2015-02-21 DIAGNOSIS — F418 Other specified anxiety disorders: Secondary | ICD-10-CM

## 2015-02-21 DIAGNOSIS — I639 Cerebral infarction, unspecified: Secondary | ICD-10-CM | POA: Diagnosis not present

## 2015-02-21 DIAGNOSIS — N183 Chronic kidney disease, stage 3 unspecified: Secondary | ICD-10-CM

## 2015-02-21 DIAGNOSIS — E034 Atrophy of thyroid (acquired): Secondary | ICD-10-CM | POA: Diagnosis not present

## 2015-02-21 DIAGNOSIS — R443 Hallucinations, unspecified: Secondary | ICD-10-CM | POA: Diagnosis not present

## 2015-03-30 ENCOUNTER — Encounter: Payer: Self-pay | Admitting: Adult Health

## 2015-03-30 NOTE — Progress Notes (Signed)
Patient ID: Jacqueline Mccann, female   DOB: March 27, 1920, 81 y.o.   MRN: [id number]   Facility: [facility]       No Known Allergies  Chief Complaint  Patient presents with  . Annual Exam    HPI:  She is a long term resident of this facility being seen for her annual exam. She is without significant change in her status. She has not been hospitalized in the past year. She is not a candidate for dexa scan or mammogram. She is unable to participate in the hpi or ros. There are no nursing concerns being voiced today.   Past Medical History  Diagnosis Date  . Hypertension   . Dementia   . Stroke (HCC)   . Hyperlipidemia   . Renal disorder   . Renal failure   . Breast cancer in situ   . Nephrolithiasis   . Thyroid nodule   . Fracture of hip, right, closed (HCC) 01/24/2013  . Venous stasis dermatitis 12/25/2013  . Senile osteoporosis 12/25/2013  . Chronic kidney disease, stage III (moderate) 12/25/2013    Past Surgical History  Procedure Laterality Date  . Femur im nail Right 01/25/2013    Procedure: INTRAMEDULLARY (IM) NAIL FEMORAL;  Surgeon: Sheral Apley, MD;  Location: [facility] OR;  Service: Orthopedics;  Laterality: Right;    Social History   Social History  . Marital Status: Widowed    Spouse Name: N/A  . Number of Children: N/A  . Years of Education: N/A   Occupational History  . Not on file.   Social History Main Topics  . Smoking status: Unknown If Ever Smoked  . Smokeless tobacco: Not on file  . Alcohol Use: Not on file  . Drug Use: Not on file  . Sexual Activity: Not on file   Other Topics Concern  . Not on file   Social History Narrative    VITAL SIGNS BP 158/63 mmHg  Pulse 98  Ht  (1.651 m)  Wt 120 lb (54.432 kg)  BMI 19.97 kg/m2  Patient's Medications  New Prescriptions   No medications on file  Previous Medications   ACETAMINOPHEN (TYLENOL) 500 MG TABLET    Take 500 mg by mouth every 6 (six) hours as needed for moderate pain (2 tablets for  severe pain).    BACLOFEN (LIORESAL) 10 MG TABLET    Take 5 mg by mouth 3 (three) times daily as needed for muscle spasms.   CLOPIDOGREL (PLAVIX) 75 MG TABLET    Take 75 mg by mouth daily.   DOCUSATE SODIUM 100 MG CAPS    Take 100 mg by mouth 2 (two) times daily.   DORZOLAMIDE (TRUSOPT) 2 % OPHTHALMIC SOLUTION    Place 1 drop into both eyes 2 (two) times daily.   LATANOPROST (XALATAN) 0.005 % OPHTHALMIC SOLUTION    Place 1 drop into both eyes at bedtime.    LORAZEPAM (ATIVAN) 2 MG/ML CONCENTRATED SOLUTION    Take 0.5 mg by mouth 2 (two) times daily as needed for anxiety.    METOPROLOL TARTRATE (LOPRESSOR) 25 MG TABLET    Take 12.5 mg by mouth 2 (two) times daily.   MIRTAZAPINE (REMERON) 7.5 MG TABLET    Take 7.5 mg by mouth at bedtime.   MULTIPLE VITAMINS-MINERALS (DECUBI-VITE) CAPS    Take 2 capsules by mouth daily.   POLYETHYLENE GLYCOL (MIRALAX / GLYCOLAX) PACKET    Take 17 g by mouth every other day.    QUETIAPINE (SEROQUEL) 100 MG  TABLET    Take 100 mg by mouth 2 (two) times daily.   SERTRALINE (ZOLOFT) 50 MG TABLET    Take 50 mg by mouth at bedtime.   SODIUM CHLORIDE (MURO 128) 5 % OPHTHALMIC OINTMENT    Place 1 application into both eyes every 6 (six) hours as needed for irritation.  Modified Medications   No medications on file  Discontinued Medications   No medications on file     SIGNIFICANT DIAGNOSTIC EXAMS    LABS REVIEWED;   12-28-13: wbc 5.1 hgb 10.0; hct 29.1; mcv 97.8; plt 148; glucose 85; bun 22; creat 1.2; k+4.3;  na++ 143; liver normal albumin 3.0; chol 183; ldl 114; trig 75 tsh 0.616  06-24-14: wbc 5.3; hgb 10.5; hct 31.9; mcv 97 plt 196; glucose 77; bun 45; creat 1.40; k+4.8; na++142; liver normal albumin 3.0     Review of Systems  Unable to perform ROS: Dementia      Physical Exam  Constitutional: No distress.  Frail   Eyes: Conjunctivae are normal.  Neck: Neck supple. No JVD present. No thyromegaly present.  Cardiovascular: Normal rate, regular rhythm  and intact distal pulses.   Respiratory: Effort normal and breath sounds normal. No respiratory distress. She has no wheezes.  GI: Soft. Bowel sounds are normal. She exhibits no distension. There is no tenderness.  Musculoskeletal: She exhibits no edema.  Able to move all extremities  Left hand with contracture does use splint   Lymphadenopathy:    She has no cervical adenopathy.  Neurological: She is alert.  Skin: Skin is warm and dry. She is not diaphoretic.     ASSESSMENT/ PLAN:  1.  Hypertension: will continue lopressor 12.5 mg twice daily is presently stable.   2. Hallucinations and delusions: she does have hallucinations about work and going places. These are interfering with her ability to participate with her environment; they do not cause her distress; will continue seroquel 100 mg twice daily   3. Anxiety and depression: will continue ativan 0.5 mg twice daily as needed for anxiety;  will continue zoloft 50 mg daily; she does continue to receive benefit from these medications  will monitor   4. Constipation: will continue colace twice daily and miralax every other day   5. Vascular dementia: she is without recent significant change; she is not on medications at this time; current weight is 120 pounds; will not make changes will monitor her status.   6. Glaucoma: will continue trusopt to both eyes twice daily; xalatan to both eyes nightly;   7. Hypothyroidism: is presently not on medications; her tsh is 0.616; will not make changes will monitor   8. CVA: she is neurologically without change; will continue plavix 75 mg daily   9. CKD stage III: her current bun 45; creat 1.4. Will not make changes will monitor   10. FTT: her current weight is 120 pounds; she has gained weight from 107 pounds in June of this year; per staff her appetite remains very poor; but she does take supplements;   Her albumin is 3.0will wean her off the remeron at this time for one week then stop. Will  monitor her status.     Time spent with patient  40  minutes >50% time spent counseling; reviewing medical record; tests; labs; and developing future plan of care       Synthia InnocentDeborah Green NP [facility]  Contact [phone] Monday through Friday 8am- 5pm  After hours call [phone]

## 2015-04-01 ENCOUNTER — Non-Acute Institutional Stay (SKILLED_NURSING_FACILITY): Payer: Medicare Other | Admitting: Internal Medicine

## 2015-04-01 ENCOUNTER — Encounter: Payer: Self-pay | Admitting: Internal Medicine

## 2015-04-01 DIAGNOSIS — N183 Chronic kidney disease, stage 3 unspecified: Secondary | ICD-10-CM

## 2015-04-01 DIAGNOSIS — I10 Essential (primary) hypertension: Secondary | ICD-10-CM

## 2015-04-01 DIAGNOSIS — F01518 Vascular dementia, unspecified severity, with other behavioral disturbance: Secondary | ICD-10-CM

## 2015-04-01 DIAGNOSIS — F418 Other specified anxiety disorders: Secondary | ICD-10-CM

## 2015-04-01 DIAGNOSIS — F0151 Vascular dementia with behavioral disturbance: Secondary | ICD-10-CM | POA: Diagnosis not present

## 2015-04-01 DIAGNOSIS — E038 Other specified hypothyroidism: Secondary | ICD-10-CM

## 2015-04-01 DIAGNOSIS — Z8673 Personal history of transient ischemic attack (TIA), and cerebral infarction without residual deficits: Secondary | ICD-10-CM

## 2015-04-01 DIAGNOSIS — E034 Atrophy of thyroid (acquired): Secondary | ICD-10-CM

## 2015-04-01 LAB — HM DIABETES EYE EXAM

## 2015-04-01 NOTE — Progress Notes (Signed)
Patient ID: Jacqueline Mccann, female   DOB: [birth year], 81 y.o.   MRN: [id number]    DATE: 04/01/15  Location:  [facility]    Place of Service: [phone])   Extended Emergency Contact Information Primary Emergency Contact: [place] Address: [address]          [place], [place] [address] [place] Home Phone: [phone] Relation: Son Secondary Emergency Contact: Melburn Popper Address: [address]          [place] [address] [place] Home Phone: [phone] Mobile Phone: [phone] Relation: Daughter  Advanced Directive information  FULL CODE  Chief Complaint  Patient presents with  . Medical Management of Chronic Issues    HPI:  81 yo female long term resident seen today for f/u. She is a poor historian due to dementia. Hx obtained from chart. No falls. No nursing issues  Dementia/depression/anxiety - mood stable on lorazepam prn, remeron, seroquel and sertraline  Thyroid - stable off med  HTN- BP conrolled on metoprolol  Hx CVA  - stable on plavix. Takes muscle relaxer prn.  Hyperlipidemia - diet controlled  Hx CKD - unchanged  Osteoporosis - stable  She uses eye gtts for glaucoma. She takes mirlax abd colace for constipation  Past Medical History  Diagnosis Date  . Hypertension   . Dementia   . Stroke (HCC)   . Hyperlipidemia   . Renal disorder   . Renal failure   . Breast cancer in situ   . Nephrolithiasis   . Thyroid nodule   . Fracture of hip, right, closed (HCC) 01/24/2013  . Venous stasis dermatitis 12/25/2013  . Senile osteoporosis 12/25/2013  . Chronic kidney disease, stage III (moderate) 12/25/2013    Past Surgical History  Procedure Laterality Date  . Femur im nail Right 01/25/2013    Procedure: INTRAMEDULLARY (IM) NAIL FEMORAL;  Surgeon: Sheral Apley, MD;  Location: [facility] OR;  Service: Orthopedics;  Laterality: Right;    Patient Care Team: Kirt Boys, DO as PCP -  General (Internal Medicine) Sharee Holster, NP as Nurse Practitioner (Geriatric Medicine) [facility] (Skilled Nursing Facility)  Social History   Social History  . Marital Status: Widowed    Spouse Name: N/A  . Number of Children: N/A  . Years of Education: N/A   Occupational History  . Not on file.   Social History Main Topics  . Smoking status: Unknown If Ever Smoked  . Smokeless tobacco: Not on file  . Alcohol Use: Not on file  . Drug Use: Not on file  . Sexual Activity: Not on file   Other Topics Concern  . Not on file   Social History Narrative     has no tobacco, alcohol, and drug history on file.  Immunization History  Administered Date(s) Administered  . Influenza-Unspecified 01/03/2015    No Known Allergies  Medications: Patient's Medications  New Prescriptions   No medications on file  Previous Medications   ACETAMINOPHEN (TYLENOL) 500 MG TABLET    Take 500 mg by mouth every 6 (six) hours as needed for moderate pain (2 tablets for severe pain).    BACLOFEN (LIORESAL) 10 MG TABLET    Take 5 mg by mouth 3 (three) times daily as needed for muscle spasms.   CLOPIDOGREL (PLAVIX) 75 MG TABLET    Take 75 mg by mouth daily.   DOCUSATE SODIUM 100 MG CAPS    Take 100 mg by  mouth 2 (two) times daily.   DORZOLAMIDE (TRUSOPT) 2 % OPHTHALMIC SOLUTION    Place 1 drop into both eyes 2 (two) times daily.   LATANOPROST (XALATAN) 0.005 % OPHTHALMIC SOLUTION    Place 1 drop into both eyes at bedtime.    LORAZEPAM (ATIVAN) 2 MG/ML CONCENTRATED SOLUTION    Take 0.5 mg by mouth 2 (two) times daily as needed for anxiety.    METOPROLOL TARTRATE (LOPRESSOR) 25 MG TABLET    Take 12.5 mg by mouth 2 (two) times daily.   MIRTAZAPINE (REMERON) 7.5 MG TABLET    Take 7.5 mg by mouth at bedtime.   MULTIPLE VITAMINS-MINERALS (DECUBI-VITE) CAPS    Take 2 capsules by mouth daily.   POLYETHYLENE GLYCOL (MIRALAX / GLYCOLAX) PACKET    Take 17 g by mouth every other day.      QUETIAPINE (SEROQUEL) 100 MG TABLET    Take 100 mg by mouth 2 (two) times daily.   SERTRALINE (ZOLOFT) 50 MG TABLET    Take 50 mg by mouth at bedtime.   SODIUM CHLORIDE (MURO 128) 5 % OPHTHALMIC OINTMENT    Place 1 application into both eyes every 6 (six) hours as needed for irritation.  Modified Medications   No medications on file  Discontinued Medications   No medications on file    Review of Systems  Unable to perform ROS: Dementia    Filed Vitals:   04/01/15 1657  BP: 148/85  Pulse: 75  Temp: 97.8 F (36.6 C)  Weight: 113 lb (51.256 kg)  SpO2: 95%   Body mass index is 18.8 kg/(m^2).  Physical Exam  Constitutional: She appears well-developed.  Frail appearing sitting in w/c in NAD  HENT:  Mouth/Throat: Oropharynx is clear and moist. No oropharyngeal exudate.  Eyes: Pupils are equal, round, and reactive to light. No scleral icterus.  Neck: Neck supple. Carotid bruit is not present. No tracheal deviation present.  Cardiovascular: Normal rate, regular rhythm and intact distal pulses.   Occasional extrasystoles are present. Exam reveals no gallop and no friction rub.   Murmur (1/6 SEM) heard. No LE edema b/l. no calf TTP.   Pulmonary/Chest: Breath sounds normal. No stridor. No respiratory distress. She has no wheezes. She has no rales.  Poor inspiratory effort  Abdominal: Soft. Bowel sounds are normal. She exhibits no distension and no mass. There is no hepatomegaly. There is no tenderness. There is no rebound and no guarding.  Musculoskeletal: She exhibits edema.  Lymphadenopathy:    She has no cervical adenopathy.  Neurological: She is alert.  Skin: Skin is warm and dry. No rash noted.  Psychiatric: She has a normal mood and affect. Her behavior is normal. Thought content is delusional.     Labs reviewed: No visits with results within 3 Month(s) from this visit. Latest known visit with results is:  Admission on 06/30/2013, Discharged on 06/30/2013  Component  Date Value Ref Range Status  . Sodium 06/30/2013 145  137 - 147 mEq/L Final  . Potassium 06/30/2013 4.4  3.7 - 5.3 mEq/L Final  . Chloride 06/30/2013 105  96 - 112 mEq/L Final  . BUN 06/30/2013 36* 6 - 23 mg/dL Final  . Creatinine, Ser 06/30/2013 1.50* 0.50 - 1.10 mg/dL Final  . Glucose, Bld 16/01/9603 130* 70 - 99 mg/dL Final  . Calcium, Ion 54/12/8117 1.23  1.13 - 1.30 mmol/L Final  . TCO2 06/30/2013 25  0 - 100 mmol/L Final  . Hemoglobin 06/30/2013 10.9* 12.0 - 15.0 g/dL  Final  . HCT 06/30/2013 32.0* 36.0 - 46.0 % Final  . Troponin i, poc 06/30/2013 0.02  0.00 - 0.08 ng/mL Final  . Comment 3 06/30/2013          Final   Comment: Due to the release kinetics of cTnI,                          a negative result within the first hours                          of the onset of symptoms does not rule out                          myocardial infarction with certainty.                          If myocardial infarction is still suspected,                          repeat the test at appropriate intervals.    No results found.   Assessment/Plan   ICD-9-CM ICD-10-CM   1. Vascular dementia with behavioral disturbance - stable 290.40 F01.51   2. Essential hypertension, benign - stable 401.1 I10   3. Hypothyroidism due to acquired atrophy of thyroid - stable 244.8 E03.8    246.8 E03.4   4. Depression with anxiety - stable 300.4 F41.8   5. History of stroke - stable V12.54 Z86.73   6. Chronic kidney disease, stage III (moderate) - stable 585.3 N18.3     Pt is medically stable on current tx plan. Continue current medications as ordered. PT/OT/ST as indicated. Will follow  Ritchie Klee S. Ancil Linseyarter, D. O., F. A. C. O. I.  [facility] [address] [place], [place] [address] [phone] Cell (Monday-Friday 8 AM - 5 PM) [phone] After 5 PM and follow prompts

## 2015-04-07 ENCOUNTER — Encounter: Payer: Self-pay | Admitting: Adult Health

## 2015-04-07 NOTE — Progress Notes (Signed)
Patient ID: Jacqueline Mccann, female   DOB: [birth year], 81 y.o.   MRN: [id number]    Facility[facility]      No Known Allergies  Chief Complaint  Patient presents with  . Medical Management of Chronic Issues    HPI:  She is a long term resident of this facility being seen for the management of her chronic illnesses. She is without significant change in her status. She cannot fully participate in the hpi or ros. There are no nursing concerns at this time.    Past Medical History  Diagnosis Date  . Hypertension   . Dementia   . Stroke (HCC)   . Hyperlipidemia   . Renal disorder   . Renal failure   . Breast cancer in situ   . Nephrolithiasis   . Thyroid nodule   . Fracture of hip, right, closed (HCC) 01/24/2013  . Venous stasis dermatitis 12/25/2013  . Senile osteoporosis 12/25/2013  . Chronic kidney disease, stage III (moderate) 12/25/2013    Past Surgical History  Procedure Laterality Date  . Femur im nail Right 01/25/2013    Procedure: INTRAMEDULLARY (IM) NAIL FEMORAL;  Surgeon: Sheral Apley, MD;  Location: [facility] OR;  Service: Orthopedics;  Laterality: Right;    VITAL SIGNS BP 98/50 mmHg  Pulse 60  Ht  (1.651 m)  Wt 116 lb (52.617 kg)  BMI 19.30 kg/m2  Patient's Medications  New Prescriptions   No medications on file  Previous Medications   ACETAMINOPHEN (TYLENOL) 500 MG TABLET    Take 500 mg by mouth every 6 (six) hours as needed for moderate pain (2 tablets for severe pain).    BACLOFEN (LIORESAL) 10 MG TABLET    Take 5 mg by mouth 3 (three) times daily as needed for muscle spasms.   CLOPIDOGREL (PLAVIX) 75 MG TABLET    Take 75 mg by mouth daily.   DOCUSATE SODIUM 100 MG CAPS    Take 100 mg by mouth 2 (two) times daily.   DORZOLAMIDE (TRUSOPT) 2 % OPHTHALMIC SOLUTION    Place 1 drop into both eyes 2 (two) times daily.   LATANOPROST (XALATAN) 0.005 % OPHTHALMIC SOLUTION    Place 1 drop into both eyes at bedtime.    LORAZEPAM (ATIVAN) 2 MG/ML  CONCENTRATED SOLUTION    Take 0.5 mg by mouth 2 (two) times daily as needed for anxiety.    METOPROLOL TARTRATE (LOPRESSOR) 25 MG TABLET    Take 12.5 mg by mouth 2 (two) times daily.   MIRTAZAPINE (REMERON) 7.5 MG TABLET    Take 7.5 mg by mouth at bedtime.   MULTIPLE VITAMINS-MINERALS (DECUBI-VITE) CAPS    Take 2 capsules by mouth daily.   POLYETHYLENE GLYCOL (MIRALAX / GLYCOLAX) PACKET    Take 17 g by mouth every other day.    QUETIAPINE (SEROQUEL) 100 MG TABLET    Take 100 mg by mouth 2 (two) times daily.   SERTRALINE (ZOLOFT) 50 MG TABLET    Take 50 mg by mouth at bedtime.   SODIUM CHLORIDE (MURO 128) 5 % OPHTHALMIC OINTMENT    Place 1 application into both eyes every 6 (six) hours as needed for irritation.  Modified Medications   No medications on file  Discontinued Medications   No medications on file     SIGNIFICANT DIAGNOSTIC EXAMS    LABS REVIEWED;   06-24-14: wbc 5.3; hgb 10.5; hct 31.AMILLIANA HAYWORTH6; glucose 77; bun 45; creat 1.40; k+4.8; na++142; liver normal  albumin 3.0     Review of Systems  Unable to perform ROS: Dementia      Physical Exam  Constitutional: No distress.  Frail   Eyes: Conjunctivae are normal.  Neck: Neck supple. No JVD present. No thyromegaly present.  Cardiovascular: Normal rate, regular rhythm and intact distal pulses.   Respiratory: Effort normal and breath sounds normal. No respiratory distress. She has no wheezes.  GI: Soft. Bowel sounds are normal. She exhibits no distension. There is no tenderness.  Musculoskeletal: She exhibits no edema.  Able to move all extremities  Left hand with contracture does use splint   Lymphadenopathy:    She has no cervical adenopathy.  Neurological: She is alert.  Skin: Skin is warm and dry. She is not diaphoretic.     ASSESSMENT/ PLAN:  1.  Hypertension: will continue lopressor 12.5 mg twice daily is presently stable.   2. Hallucinations and delusions: she does have hallucinations about work and  going places. These are interfering with her ability to participate with her environment; they do not cause her distress; will continue seroquel 100 mg twice daily   3. Anxiety and depression: will continue ativan 0.5 mg twice daily as needed for anxiety;  will continue zoloft 50 mg daily; she does continue to receive benefit from these medications  will monitor   4. Constipation: will continue colace twice daily and miralax every other day   5. Vascular dementia: she is without recent significant change; she is not on medications at this time; current weight is 116 pounds; will not make changes will monitor her status.   6. Glaucoma: will continue trusopt to both eyes twice daily; xalatan to both eyes nightly;   7. Hypothyroidism: is presently not on medications; her tsh is 0.616; will not make changes will monitor   8. CVA: she is neurologically without change; will continue plavix 75 mg daily   9. CKD stage III: her current bun 45; creat 1.4. Will not make changes will monitor   10. FTT: her current weight is 116 pounds; she has gained weight from 107 pounds in June of this year; per staff her appetite remains very poor; but she does take supplements;   Her albumin is 3.0 will monitor her status.          [name] NP [facility]  Contact [phone] Monday through Friday 8am- 5pm  After hours call [phone]

## 2015-05-02 ENCOUNTER — Non-Acute Institutional Stay (SKILLED_NURSING_FACILITY): Payer: Medicare Other | Admitting: Adult Health

## 2015-05-02 DIAGNOSIS — E034 Atrophy of thyroid (acquired): Secondary | ICD-10-CM | POA: Diagnosis not present

## 2015-05-02 DIAGNOSIS — I10 Essential (primary) hypertension: Secondary | ICD-10-CM

## 2015-05-02 DIAGNOSIS — F418 Other specified anxiety disorders: Secondary | ICD-10-CM

## 2015-05-02 DIAGNOSIS — I638 Other cerebral infarction: Secondary | ICD-10-CM

## 2015-05-02 DIAGNOSIS — N183 Chronic kidney disease, stage 3 unspecified: Secondary | ICD-10-CM

## 2015-05-02 DIAGNOSIS — R443 Hallucinations, unspecified: Secondary | ICD-10-CM | POA: Diagnosis not present

## 2015-05-02 DIAGNOSIS — I6389 Other cerebral infarction: Secondary | ICD-10-CM

## 2015-05-02 DIAGNOSIS — E038 Other specified hypothyroidism: Secondary | ICD-10-CM

## 2015-06-02 ENCOUNTER — Encounter: Payer: Self-pay | Admitting: Adult Health

## 2015-06-02 ENCOUNTER — Non-Acute Institutional Stay (SKILLED_NURSING_FACILITY): Payer: Medicare Other | Admitting: Adult Health

## 2015-06-02 DIAGNOSIS — N183 Chronic kidney disease, stage 3 unspecified: Secondary | ICD-10-CM

## 2015-06-02 DIAGNOSIS — F0151 Vascular dementia with behavioral disturbance: Secondary | ICD-10-CM

## 2015-06-02 DIAGNOSIS — F01518 Vascular dementia, unspecified severity, with other behavioral disturbance: Secondary | ICD-10-CM

## 2015-06-02 DIAGNOSIS — I6389 Other cerebral infarction: Secondary | ICD-10-CM

## 2015-06-02 DIAGNOSIS — I638 Other cerebral infarction: Secondary | ICD-10-CM

## 2015-06-02 DIAGNOSIS — E034 Atrophy of thyroid (acquired): Secondary | ICD-10-CM | POA: Diagnosis not present

## 2015-06-02 DIAGNOSIS — R443 Hallucinations, unspecified: Secondary | ICD-10-CM | POA: Diagnosis not present

## 2015-06-02 DIAGNOSIS — F418 Other specified anxiety disorders: Secondary | ICD-10-CM

## 2015-06-02 DIAGNOSIS — I10 Essential (primary) hypertension: Secondary | ICD-10-CM

## 2015-06-02 DIAGNOSIS — E038 Other specified hypothyroidism: Secondary | ICD-10-CM

## 2015-06-02 NOTE — Progress Notes (Signed)
Patient ID: Jacqueline Mccann, female   DOB: [birth year], 82 y.o.   MRN: [id number]   Facility: [facility]       No Known Allergies  Chief Complaint  Patient presents with  . Medical Management of Chronic Issues    HPI:  She is a long term resident of this facility being seen for the management of her chronic illnesses. Overall there little change in her status. She is unable to participate in the hpi or ros. There are no nursing concerns at this time. Her current weight is 114 pounds; Nov 2016 her weight was 116 pounds.    Past Medical History  Diagnosis Date  . Hypertension   . Dementia   . Stroke (HCC)   . Hyperlipidemia   . Renal disorder   . Renal failure   . Breast cancer in situ   . Nephrolithiasis   . Thyroid nodule   . Fracture of hip, right, closed (HCC) 01/24/2013  . Venous stasis dermatitis 12/25/2013  . Senile osteoporosis 12/25/2013  . Chronic kidney disease, stage III (moderate) 12/25/2013    Past Surgical History  Procedure Laterality Date  . Femur im nail Right 01/25/2013    Procedure: INTRAMEDULLARY (IM) NAIL FEMORAL;  Surgeon: Sheral Apley, MD;  Location: [facility] OR;  Service: Orthopedics;  Laterality: Right;    VITAL SIGNS BP 144/68 mmHg  Pulse 78  Temp(Src) 97.5 F (36.4 C)  Ht  (1.651 m)  Wt 114 lb (51.71 kg)  BMI 18.97 kg/m2  SpO2 94%  Patient's Medications  New Prescriptions   No medications on file  Previous Medications   ACETAMINOPHEN (TYLENOL) 500 MG TABLET    Take 500 mg by mouth every 6 (six) hours as needed for moderate pain (2 tablets for severe pain).    BACLOFEN (LIORESAL) 10 MG TABLET    Take 5 mg by mouth 3 (three) times daily as needed for muscle spasms.   CLOPIDOGREL (PLAVIX) 75 MG TABLET    Take 75 mg by mouth daily.   DOCUSATE SODIUM 100 MG CAPS    Take 100 mg by mouth 2 (two) times daily.   DORZOLAMIDE (TRUSOPT) 2 % OPHTHALMIC SOLUTION    Place 1 drop into both eyes 2 (two) times daily.   LATANOPROST (XALATAN)  0.005 % OPHTHALMIC SOLUTION    Place 1 drop into both eyes at bedtime.    LORAZEPAM (ATIVAN) 2 MG/ML CONCENTRATED SOLUTION    Take 0.5 mg by mouth 2 (two) times daily as needed for anxiety.    METOPROLOL TARTRATE (LOPRESSOR) 25 MG TABLET    Take 12.5 mg by mouth 2 (two) times daily.   MULTIPLE VITAMINS-MINERALS (DECUBI-VITE) CAPS    Take 1 capsule by mouth daily.    POLYETHYLENE GLYCOL (MIRALAX / GLYCOLAX) PACKET    Take 17 g by mouth every other day.    QUETIAPINE (SEROQUEL) 100 MG TABLET    Take 100 mg by mouth daily.    QUETIAPINE (SEROQUEL) 50 MG TABLET    Take 50 mg by mouth at bedtime.   SERTRALINE (ZOLOFT) 50 MG TABLET    Take 50 mg by mouth at bedtime.   SODIUM CHLORIDE (MURO 128) 5 % OPHTHALMIC OINTMENT    Place 1 application into both eyes every 6 (six) hours as needed for irritation.  Modified Medications   No medications on file  Discontinued Medications   No medications on file     SIGNIFICANT DIAGNOSTIC EXAMS  LABS REVIEWED;   06-24-14: wbc  5.3; hgb 10.5; hct 31.9; mcv 97 plt 196; glucose 77; bun 45; creat 1.40; k+4.8; na++142; liver normal albumin 3.0  12-11-14; wbc 4.5; hgb 12.1; hct 36.1; mcv 93.8; plt 175; glucose 99; bun 55; creat 1.46; k+ 4.2; na++140 liver normal albumin 3.6; tsh 0.666; hgb a1c 6.0     Review of Systems  Unable to perform ROS: Dementia      Physical Exam  Constitutional: No distress.  Frail   Eyes: Conjunctivae are normal.  Neck: Neck supple. No JVD present. No thyromegaly present.  Cardiovascular: Normal rate, regular rhythm and intact distal pulses.   Respiratory: Effort normal and breath sounds normal. No respiratory distress. She has no wheezes.  GI: Soft. Bowel sounds are normal. She exhibits no distension. There is no tenderness.  Musculoskeletal: She exhibits no edema.  Able to move all extremities  Left hand with contracture does use splint   Lymphadenopathy:    She has no cervical adenopathy.  Neurological: She is alert.  Skin:  Skin is warm and dry. She is not diaphoretic.     ASSESSMENT/ PLAN:  1.  Hypertension: will continue lopressor 12.5 mg twice daily is presently stable.   2. Hallucinations and delusions: she does have hallucinations about work and going places. These are interfering with her ability to participate with her environment; they do not cause her distress; will continue seroquel 100 mg twice daily   3. Anxiety and depression: will continue ativan 0.5 mg twice daily as needed for anxiety;  will continue zoloft 50 mg daily; she does continue to receive benefit from these medications  will monitor   4. Constipation: will continue colace twice daily and miralax every other day   5. Vascular dementia: she is without recent significant change; she is not on medications at this time; current weight is 114 pounds; will not make changes will monitor her status.   6. Glaucoma: will continue trusopt to both eyes twice daily; xalatan to both eyes nightly;   7. Hypothyroidism: is presently not on medications; her tsh is 0.666; will not make changes will monitor   8. CVA: she is neurologically without change; will continue plavix 75 mg daily   9. CKD stage III: her current bun 55; creat 1.46. Will not make changes will monitor   10. FTT: her current weight is 114 pounds; she has gained weight from 107 pounds in June 2016; per staff her appetite remains very poor; but she does take supplements;   Her albumin is 3.0 will monitor her status.           Synthia Innocent NP [facility]  Contact [phone] Monday through Friday 8am- 5pm  After hours call [phone]

## 2015-06-02 NOTE — Progress Notes (Signed)
Patient ID: Jacqueline Mccann, female   DOB: 04-Feb-1920, 82 y.o.   MRN: [id number]   Facility[facility]       No Known Allergies  Chief Complaint  Patient presents with  . Medical Management of Chronic Issues    Follow up    HPI:  She is a long term resident of this facility being seen for the management of her chronic illnesses. Overall there is little change in her status; her weight las month was 114 pounds; this month 113 pounds. She is unable to participate in the hpi or ros. There are no nursing concerns at this time.    Past Medical History  Diagnosis Date  . Hypertension   . Dementia   . Stroke (HCC)   . Hyperlipidemia   . Renal disorder   . Renal failure   . Breast cancer in situ   . Nephrolithiasis   . Thyroid nodule   . Fracture of hip, right, closed (HCC) 01/24/2013  . Venous stasis dermatitis 12/25/2013  . Senile osteoporosis 12/25/2013  . Chronic kidney disease, stage III (moderate) 12/25/2013    Past Surgical History  Procedure Laterality Date  . Femur im nail Right 01/25/2013    Procedure: INTRAMEDULLARY (IM) NAIL FEMORAL;  Surgeon: Sheral Apley, MD;  Location: [facility] OR;  Service: Orthopedics;  Laterality: Right;    VITAL SIGNS BP 140/70 mmHg  Pulse 78  Temp(Src) 97.6 F (36.4 C) (Oral)  Resp 18  Ht  (1.6 m)  Wt 113 lb (51.256 kg)  BMI 20.02 kg/m2  SpO2 95%  Patient's Medications  New Prescriptions   No medications on file  Previous Medications   ACETAMINOPHEN (TYLENOL) 500 MG TABLET    Take 500 mg by mouth every 6 (six) hours as needed for moderate pain (2 tablets for severe pain).    BACLOFEN (LIORESAL) 10 MG TABLET    Take 5 mg by mouth 3 (three) times daily as needed for muscle spasms.   CLOPIDOGREL (PLAVIX) 75 MG TABLET    Take 75 mg by mouth daily.   DOCUSATE SODIUM 100 MG CAPS    Take 100 mg by mouth 2 (two) times daily.   DORZOLAMIDE (TRUSOPT) 2 % OPHTHALMIC SOLUTION    Place 1 drop into both eyes 2 (two) times daily.   LATANOPROST (XALATAN) 0.005 % OPHTHALMIC SOLUTION    Place 1 drop into both eyes at bedtime.    LORAZEPAM (ATIVAN) 2 MG/ML CONCENTRATED SOLUTION    Take 0.5 mg by mouth 2 (two) times daily as needed for anxiety.    METOPROLOL TARTRATE (LOPRESSOR) 25 MG TABLET    Take 12.5 mg by mouth 2 (two) times daily.   MULTIPLE VITAMINS-MINERALS (DECUBI-VITE) CAPS    Take 1 capsule by mouth daily.    POLYETHYLENE GLYCOL (MIRALAX / GLYCOLAX) PACKET    Take 17 g by mouth every other day.    QUETIAPINE (SEROQUEL) 100 MG TABLET    Take 100 mg by mouth daily.    QUETIAPINE (SEROQUEL) 50 MG TABLET    Take 50 mg by mouth at bedtime.   SERTRALINE (ZOLOFT) 50 MG TABLET    Take 50 mg by mouth at bedtime.   SODIUM CHLORIDE (MURO 128) 5 % OPHTHALMIC OINTMENT    Place 1 application into both eyes every 6 (six) hours as needed for irritation.  Modified Medications   No medications on file  Discontinued Medications   MIRTAZAPINE (REMERON) 7.5 MG TABLET    Take 7.5 mg  by mouth at bedtime. Reported on 06/02/2015     SIGNIFICANT DIAGNOSTIC EXAMS   LABS REVIEWED:   06-24-14: wbc 5.3; hgb 10.5; hct 31.9; mcv 97 plt 196; glucose 77; bun 45; creat 1.40; k+4.8; na++142; liver normal albumin 3.0  12-11-14: wbc 4.;5 hgb 12.1; hct 36.1; mcv 93.8; plt 175; glucose 99; bun 55; creat 1.44; k+ 4.2; na++ 140; liver normal albumin 3.6; tsh 0.666; hgb a1c 6.0     Review of Systems  Unable to perform ROS: Dementia      Physical Exam  Constitutional: No distress.  Frail   Eyes: Conjunctivae are normal.  Neck: Neck supple. No JVD present. No thyromegaly present.  Cardiovascular: Normal rate, regular rhythm and intact distal pulses.   Respiratory: Effort normal and breath sounds normal. No respiratory distress. She has no wheezes.  GI: Soft. Bowel sounds are normal. She exhibits no distension. There is no tenderness.  Musculoskeletal: She exhibits no edema.  Able to move all extremities  Left hand with contracture does use  splint   Lymphadenopathy:    She has no cervical adenopathy.  Neurological: She is alert.  Skin: Skin is warm and dry. She is not diaphoretic.     ASSESSMENT/ PLAN:  1.  Hypertension: will continue lopressor 12.5 mg twice daily is presently stable.   2. Hallucinations and delusions: she does have hallucinations about work and going places. These are interfering with her ability to participate with her environment; they do not cause her distress; will continue seroquel 100 mg twice daily   3. Anxiety and depression: will continue ativan 0.5 mg twice daily as needed for anxiety;  will continue zoloft 50 mg daily; she does continue to receive benefit from these medications  will monitor   4. Constipation: will continue colace twice daily and miralax every other day   5. Vascular dementia: she is without recent significant change; she is not on medications at this time; current weight is 113 pounds; will not make changes will monitor her status.  In the end stage of dementia; weight loss is expected.   6. Glaucoma: will continue trusopt to both eyes twice daily; xalatan to both eyes nightly;   7. Hypothyroidism: is presently not on medications; her tsh is 0.666; will not make changes will monitor   8. CVA: she is neurologically without change; will continue plavix 75 mg daily   9. CKD stage III: her current bun 55; creat 1.46. Will not make changes will monitor   10. FTT: her current weight is 113 pounds; she has gained weight from 107 pounds in June 2016; her weight in Nov 2016 was 116 pounds; per staff her appetite remains very poor; but she does take supplements;   Her albumin is 3.0 will monitor her status.  If she continues to lose weight will need to address further       Will check cbc; cmp; tsh         Synthia Innocent NP [facility]  Contact [phone] Monday through Friday 8am- 5pm  After hours call [phone]

## 2015-06-04 LAB — HEPATIC FUNCTION PANEL
ALT: 9 U/L (ref 7–35)
AST: 20 U/L (ref 13–35)
Alkaline Phosphatase: 76 U/L (ref 25–125)

## 2015-06-04 LAB — BASIC METABOLIC PANEL
BUN: 51 mg/dL — AB (ref 4–21)
CREATININE: 1.8 mg/dL — AB (ref 0.5–1.1)
GLUCOSE: 148 mg/dL
POTASSIUM: 5.2 mmol/L (ref 3.4–5.3)
Sodium: 144 mmol/L (ref 137–147)

## 2015-06-04 LAB — CBC AND DIFFERENTIAL
HCT: 39 % (ref 36–46)
Hemoglobin: 13.4 g/dL (ref 12.0–16.0)
Platelets: 167 10*3/uL (ref 150–399)
WBC: 4.6 10^3/mL

## 2015-06-04 LAB — TSH: TSH: 0.36 u[IU]/mL — AB (ref 0.41–5.90)

## 2015-06-05 ENCOUNTER — Encounter: Payer: Self-pay | Admitting: Adult Health

## 2015-06-05 ENCOUNTER — Non-Acute Institutional Stay (SKILLED_NURSING_FACILITY): Payer: Medicare Other | Admitting: Adult Health

## 2015-06-05 DIAGNOSIS — E038 Other specified hypothyroidism: Secondary | ICD-10-CM | POA: Diagnosis not present

## 2015-06-05 DIAGNOSIS — E034 Atrophy of thyroid (acquired): Secondary | ICD-10-CM

## 2015-06-05 LAB — HM DIABETES FOOT EXAM

## 2015-06-05 NOTE — Progress Notes (Signed)
Patient ID: Jacqueline Mccann, female   DOB: 1919/11/28, 82 y.o.   MRN: [id number]   Facility[facility]       No Known Allergies  Chief Complaint  Patient presents with  . Acute Visit    Lab Follow up    HPI:  Her tsh remains low at 0.36. There are thyroid abnormalities to exam. She is presently is not on medications. She is unable to fully participate in the hpi or ros. There are no nursing concerns at this time.    Past Medical History  Diagnosis Date  . Hypertension   . Dementia   . Stroke (HCC)   . Hyperlipidemia   . Renal disorder   . Renal failure   . Breast cancer in situ   . Nephrolithiasis   . Thyroid nodule   . Fracture of hip, right, closed (HCC) 01/24/2013  . Venous stasis dermatitis 12/25/2013  . Senile osteoporosis 12/25/2013  . Chronic kidney disease, stage III (moderate) 12/25/2013    Past Surgical History  Procedure Laterality Date  . Femur im nail Right 01/25/2013    Procedure: INTRAMEDULLARY (IM) NAIL FEMORAL;  Surgeon: Sheral Apley, MD;  Location: [facility];  Service: Orthopedics;  Laterality: Right;    VITAL SIGNS BP 134/67 mmHg  Pulse 77  Temp(Src) 97.6 F (36.4 C) (Oral)  Resp 18  Ht  (1.6 m)  Wt 112 lb 6 oz (50.973 kg)  BMI 19.91 kg/m2  SpO2 95%  Patient's Medications  New Prescriptions   No medications on file  Previous Medications   ACETAMINOPHEN (TYLENOL) 500 MG TABLET    Take 500 mg by mouth every 6 (six) hours as needed for moderate pain (2 tablets for severe pain).    BACLOFEN (LIORESAL) 10 MG TABLET    Take 5 mg by mouth daily as needed for muscle spasms.    CLOPIDOGREL (PLAVIX) 75 MG TABLET    Take 75 mg by mouth daily.   DOCUSATE SODIUM 100 MG CAPS    Take 100 mg by mouth 2 (two) times daily.   DORZOLAMIDE (TRUSOPT) 2 % OPHTHALMIC SOLUTION    Place 1 drop into both eyes 2 (two) times daily.   LATANOPROST (XALATAN) 0.005 % OPHTHALMIC SOLUTION    Place 1 drop into both eyes at bedtime.    LORAZEPAM (ATIVAN) 2 MG/ML  CONCENTRATED SOLUTION    Take 0.5 mg by mouth 2 (two) times daily as needed for anxiety.    METOPROLOL TARTRATE (LOPRESSOR) 25 MG TABLET    Take 12.5 mg by mouth 2 (two) times daily.   MULTIPLE VITAMINS-MINERALS (DECUBI-VITE) CAPS    Take 1 capsule by mouth daily.    POLYETHYLENE GLYCOL (MIRALAX / GLYCOLAX) PACKET    Take 17 g by mouth every other day.    QUETIAPINE (SEROQUEL) 100 MG TABLET    Take 100 mg by mouth daily.    QUETIAPINE (SEROQUEL) 50 MG TABLET    Take 50 mg by mouth at bedtime.   SERTRALINE (ZOLOFT) 50 MG TABLET    Take 50 mg by mouth at bedtime.   SODIUM CHLORIDE (MURO 128) 5 % OPHTHALMIC OINTMENT    Place 1 application into both eyes every 6 (six) hours as needed for irritation.  Modified Medications   No medications on file  Discontinued Medications   No medications on file     SIGNIFICANT DIAGNOSTIC EXAMS  06-24-14: wbc 5.3; hgb 10.5; hct 31.9; mcv 97 plt 196; glucose 77; bun 45; creat 1.40;  k+4.8; na++142; liver normal albumin 3.0  12-11-14: wbc 4.;5 hgb 12.1; hct 36.1; mcv 93.8; plt 175; glucose 99; bun 55; creat 1.44; k+ 4.2; na++ 140; liver normal albumin 3.6; tsh 0.666; hgb a1c 6.0  06-04-15: wbc 4.6; hgb 13.4; hct 390.; mcv 94.7; plt 167; glucose 148; bun 51; creat 1.83; k+ 5.2; na++144; liver normal albumin 3.3; tsh 0.36     Review of Systems  Unable to perform ROS: Dementia      Physical Exam  Constitutional: No distress.  Frail   Eyes: Conjunctivae are normal.  Neck: Neck supple. No JVD present. No thyromegaly present.  Cardiovascular: Normal rate, regular rhythm and intact distal pulses.   Respiratory: Effort normal and breath sounds normal. No respiratory distress. She has no wheezes.  GI: Soft. Bowel sounds are normal. She exhibits no distension. There is no tenderness.  Musculoskeletal: She exhibits no edema.  Able to move all extremities  Left hand with contracture does use splint   Lymphadenopathy:    She has no cervical adenopathy.    Neurological: She is alert.  Skin: Skin is warm and dry. She is not diaphoretic.     ASSESSMENT/ PLAN:  1. Hypothyroidism: is presently not on medications; her tsh is 0.36; will not change her medications; will check free T3 and free T4 in light of her advanced; will monitor her status.         Synthia Innocent NP [facility]  Contact [phone] Monday through Friday 8am- 5pm  After hours call [phone]

## 2015-07-01 ENCOUNTER — Non-Acute Institutional Stay (SKILLED_NURSING_FACILITY): Payer: Medicare Other | Admitting: Adult Health

## 2015-07-01 ENCOUNTER — Encounter: Payer: Self-pay | Admitting: Adult Health

## 2015-07-01 DIAGNOSIS — I10 Essential (primary) hypertension: Secondary | ICD-10-CM

## 2015-07-01 DIAGNOSIS — N183 Chronic kidney disease, stage 3 unspecified: Secondary | ICD-10-CM

## 2015-07-01 DIAGNOSIS — E038 Other specified hypothyroidism: Secondary | ICD-10-CM | POA: Diagnosis not present

## 2015-07-01 DIAGNOSIS — I638 Other cerebral infarction: Secondary | ICD-10-CM | POA: Diagnosis not present

## 2015-07-01 DIAGNOSIS — F01518 Vascular dementia, unspecified severity, with other behavioral disturbance: Secondary | ICD-10-CM

## 2015-07-01 DIAGNOSIS — H409 Unspecified glaucoma: Secondary | ICD-10-CM

## 2015-07-01 DIAGNOSIS — F418 Other specified anxiety disorders: Secondary | ICD-10-CM

## 2015-07-01 DIAGNOSIS — R443 Hallucinations, unspecified: Secondary | ICD-10-CM

## 2015-07-01 DIAGNOSIS — F0151 Vascular dementia with behavioral disturbance: Secondary | ICD-10-CM | POA: Diagnosis not present

## 2015-07-01 DIAGNOSIS — E034 Atrophy of thyroid (acquired): Secondary | ICD-10-CM

## 2015-07-01 DIAGNOSIS — I6389 Other cerebral infarction: Secondary | ICD-10-CM

## 2015-07-01 LAB — T4
T3, Free: 2.3
T4,FREE (DIRECT): 0.7

## 2015-07-01 NOTE — Progress Notes (Signed)
Patient ID: Jacqueline Mccann, female   DOB: 01/07/1920, 82 y.o.   MRN: [id number]   [facility]       No Known Allergies  Chief Complaint  Patient presents with  . Medical Management of Chronic Issues    Follow-up    HPI:  She is a long term resident of this facility being seen for the management of her chronic illnesses. Her current weight is 113 pounds. There is no significant change in her status. She is unable to participate in the hpi or ros. There are no nursing concerns at this time.   Past Medical History  Diagnosis Date  . Hypertension   . Dementia   . Stroke (HCC)   . Hyperlipidemia   . Renal disorder   . Renal failure   . Breast cancer in situ   . Nephrolithiasis   . Thyroid nodule   . Fracture of hip, right, closed (HCC) 01/24/2013  . Venous stasis dermatitis 12/25/2013  . Senile osteoporosis 12/25/2013  . Chronic kidney disease, stage III (moderate) 12/25/2013    Past Surgical History  Procedure Laterality Date  . Femur im nail Right 01/25/2013    Procedure: INTRAMEDULLARY (IM) NAIL FEMORAL;  Surgeon: Sheral Apley, MD;  Location: [facility];  Service: Orthopedics;  Laterality: Right;    VITAL SIGNS BP 109/53 mmHg  Pulse 75  Temp(Src) 97.6 F (36.4 C) (Oral)  Resp 18  SpO2 93%  Patient's Medications  New Prescriptions   No medications on file  Previous Medications   ACETAMINOPHEN (TYLENOL) 500 MG TABLET    Take 500 mg by mouth every 6 (six) hours as needed for moderate pain (2 tablets for severe pain).    BACLOFEN (LIORESAL) 10 MG TABLET    Take 5 mg by mouth daily as needed for muscle spasms. Give 5 mg TID PRN Muscle Spasms   CLOPIDOGREL (PLAVIX) 75 MG TABLET    Take 75 mg by mouth daily.   DOCUSATE SODIUM 100 MG CAPS    Take 100 mg by mouth 2 (two) times daily.   DORZOLAMIDE (TRUSOPT) 2 % OPHTHALMIC SOLUTION    Place 1 drop into both eyes 2 (two) times daily.   LATANOPROST (XALATAN) 0.005 % OPHTHALMIC SOLUTION    Place 1 drop into both  eyes at bedtime.    LORAZEPAM (ATIVAN) 2 MG/ML CONCENTRATED SOLUTION    Take 0.5 mg by mouth 2 (two) times daily as needed for anxiety.    METOPROLOL TARTRATE (LOPRESSOR) 25 MG TABLET    Take 12.5 mg by mouth 2 (two) times daily.   MULTIPLE VITAMINS-MINERALS (DECUBI-VITE) CAPS    Take 1 capsule by mouth daily.    POLYETHYLENE GLYCOL (MIRALAX / GLYCOLAX) PACKET    Take 17 g by mouth every other day.    QUETIAPINE (SEROQUEL) 100 MG TABLET    Take 100 mg by mouth daily.    QUETIAPINE (SEROQUEL) 50 MG TABLET    Take 50 mg by mouth at bedtime.   SERTRALINE (ZOLOFT) 50 MG TABLET    Take 50 mg by mouth at bedtime.   SODIUM CHLORIDE (MURO 128) 5 % OPHTHALMIC OINTMENT    Place 1 application into both eyes every 6 (six) hours as needed for irritation.   UNABLE TO FIND    Med Name: House Supplement - Give two mighty shakes with lunch and dinner.  Modified Medications   No medications on file  Discontinued Medications   No medications on file  SIGNIFICANT DIAGNOSTIC EXAMS    LABS REVIEWED:   06-24-14: wbc 5.3; hgb 10.5; hct 31.9; mcv 97 plt 196; glucose 77; bun 45; creat 1.40; k+4.8; na++142; liver normal albumin 3.0  12-11-14: wbc 4.;5 hgb 12.1; hct 36.1; mcv 93.8; plt 175; glucose 99; bun 55; creat 1.44; k+ 4.2; na++ 140; liver normal albumin 3.6; tsh 0.666; hgb a1c 6.0  06-11-15: free T4: 0.7; free T3: 2.3     Review of Systems  Unable to perform ROS: Dementia      Physical Exam  Constitutional: No distress.  Frail   Eyes: Conjunctivae are normal.  Neck: Neck supple. No JVD present. No thyromegaly present.  Cardiovascular: Normal rate, regular rhythm and intact distal pulses.   Respiratory: Effort normal and breath sounds normal. No respiratory distress. She has no wheezes.  GI: Soft. Bowel sounds are normal. She exhibits no distension. There is no tenderness.  Musculoskeletal: She exhibits no edema.  Able to move all extremities  Left hand with contracture does use splint     Lymphadenopathy:    She has no cervical adenopathy.  Neurological: She is alert.  Skin: Skin is warm and dry. She is not diaphoretic.     ASSESSMENT/ PLAN:  1.  Hypertension: will continue lopressor 12.5 mg twice daily is presently stable.   2. Hallucinations and delusions: she does have hallucinations about work and going places. These are interfering with her ability to participate with her environment; they do not cause her distress; will continue seroquel 100 mg  In the AM and 50 mg in the PM    3. Anxiety and depression: will continue ativan 0.5 mg twice daily as needed for anxiety;  will continue zoloft 50 mg daily; she does continue to receive benefit from these medications  will monitor   4. Constipation: will continue colace twice daily and miralax every other day   5. Vascular dementia: she is without recent significant change; she is not on medications at this time; current weight is 113 pounds; will not make changes will monitor her status.  In the end stage of dementia; weight loss is expected.   6. Glaucoma: will continue trusopt to both eyes twice daily; xalatan to both eyes nightly;   7. Hypothyroidism: is presently not on medications; her tsh is  0.666 will monitor   8. CVA: she is neurologically without change; will continue plavix 75 mg daily   9. CKD stage III: her current bun 55; creat 1.46. Will not make changes will monitor   10. FTT: her current weight is 113 pounds; she has gained weight from 107 pounds in June 2016; her weight in Nov 2016 was 116 pounds; per staff her appetite remains very poor; but she does take supplements;   Her albumin is 3.0 will monitor her status.        Synthia Innocenteborah Yolonda Purtle NP [facility]  Contact [phone] Monday through Friday 8am- 5pm  After hours call [phone]

## 2015-07-15 ENCOUNTER — Emergency Department (HOSPITAL_COMMUNITY)
Admission: EM | Admit: 2015-07-15 | Discharge: 2015-08-11 | Disposition: E | Payer: Medicare Other | Attending: Emergency Medicine | Admitting: Emergency Medicine

## 2015-07-15 DIAGNOSIS — Z87442 Personal history of urinary calculi: Secondary | ICD-10-CM | POA: Diagnosis not present

## 2015-07-15 DIAGNOSIS — N186 End stage renal disease: Secondary | ICD-10-CM | POA: Insufficient documentation

## 2015-07-15 DIAGNOSIS — I120 Hypertensive chronic kidney disease with stage 5 chronic kidney disease or end stage renal disease: Secondary | ICD-10-CM | POA: Insufficient documentation

## 2015-07-15 DIAGNOSIS — Z8781 Personal history of (healed) traumatic fracture: Secondary | ICD-10-CM | POA: Diagnosis not present

## 2015-07-15 DIAGNOSIS — Z7902 Long term (current) use of antithrombotics/antiplatelets: Secondary | ICD-10-CM | POA: Diagnosis not present

## 2015-07-15 DIAGNOSIS — Z79899 Other long term (current) drug therapy: Secondary | ICD-10-CM | POA: Insufficient documentation

## 2015-07-15 DIAGNOSIS — Z86018 Personal history of other benign neoplasm: Secondary | ICD-10-CM | POA: Diagnosis not present

## 2015-07-15 DIAGNOSIS — F0390 Unspecified dementia without behavioral disturbance: Secondary | ICD-10-CM | POA: Insufficient documentation

## 2015-07-15 DIAGNOSIS — Z8673 Personal history of transient ischemic attack (TIA), and cerebral infarction without residual deficits: Secondary | ICD-10-CM | POA: Insufficient documentation

## 2015-07-15 DIAGNOSIS — Z992 Dependence on renal dialysis: Secondary | ICD-10-CM | POA: Diagnosis not present

## 2015-07-15 DIAGNOSIS — I469 Cardiac arrest, cause unspecified: Secondary | ICD-10-CM | POA: Insufficient documentation

## 2015-07-15 DIAGNOSIS — Z8639 Personal history of other endocrine, nutritional and metabolic disease: Secondary | ICD-10-CM | POA: Diagnosis not present

## 2015-07-15 MED ORDER — SODIUM BICARBONATE 8.4 % IV SOLN
INTRAVENOUS | Status: AC | PRN
  Administered 2015-07-15: 50 meq via INTRAVENOUS

## 2015-07-15 MED ORDER — CALCIUM CHLORIDE 10 % IV SOLN
INTRAVENOUS | Status: DC | PRN
Start: 2015-07-15 — End: 2015-07-16
  Administered 2015-07-15: 1 g via INTRAVENOUS

## 2015-07-15 MED ORDER — SODIUM CHLORIDE 0.9 % IV SOLN
INTRAVENOUS | Status: AC | PRN
  Administered 2015-07-15: 1000 mL via INTRAVENOUS

## 2015-07-15 MED ORDER — EPINEPHRINE HCL 0.1 MG/ML IJ SOSY
PREFILLED_SYRINGE | INTRAMUSCULAR | Status: AC | PRN
Start: 2015-07-15 — End: 2015-07-15
  Administered 2015-07-15: 1 mg via INTRAVENOUS

## 2015-07-16 MED FILL — Medication: Qty: 1 | Status: AC

## 2015-08-11 NOTE — ED Notes (Signed)
Post-mortem care completed at this time

## 2015-08-11 NOTE — ED Notes (Signed)
Ems arrived at [facility] at 1700 and found pt to be unresponsive and pulseless. Nursing home staff states the last time she was seen was 15 minutes prior. EMS established a IO and gave 6mg  of epi and pt was PEA on arrival. CPR is progress.

## 2015-08-11 NOTE — Code Documentation (Signed)
Patient time of death occurred at [id number] by DR.Goldston.

## 2015-08-11 NOTE — ED Provider Notes (Signed)
CSN: [id number]     Arrival date & time 08/04/2015  1720 History   First MD Initiated Contact with Patient 01/07/2016 1730     Chief Complaint  Patient presents with  . Cardiac Arrest      HPI  82 year old female with history of dementia, hypertension, kidney disease not on dialysis, who presents from her skilled nursing facility after being found unresponsive in bed. All history is obtained from EMS who spoke with nursing facility personnel. Reports that they checked on the patient 15 minutes prior and started CPR when they found her unresponsive in bed. Initial rhythm was asystole, followed by PEA after 2 rounds of epi and CPR, followed by asystole. Patient received a total of 5 rounds of epinephrine prior to arrival to the hospital. Brooke DareKing airway placed in the field.   Past Medical History  Diagnosis Date  . Hypertension   . Dementia   . Stroke (HCC)   . Hyperlipidemia   . Renal disorder   . Renal failure   . Breast cancer in situ   . Nephrolithiasis   . Thyroid nodule   . Fracture of hip, right, closed (HCC) 01/24/2013  . Venous stasis dermatitis 12/25/2013  . Senile osteoporosis 12/25/2013  . Chronic kidney disease, stage III (moderate) 12/25/2013   Past Surgical History  Procedure Laterality Date  . Femur im nail Right 01/25/2013    Procedure: INTRAMEDULLARY (IM) NAIL FEMORAL;  Surgeon: Sheral Apleyimothy D Murphy, MD;  Location: [facility] OR;  Service: Orthopedics;  Laterality: Right;   Family History  Problem Relation Age of Onset  . Family history unknown: Yes   Social History  Substance Use Topics  . Smoking status: Unknown If Ever Smoked  . Smokeless tobacco: Not on file  . Alcohol Use: Not on file   OB History    No data available     Review of Systems  Unable to perform ROS: Patient unresponsive      Allergies  Review of patient's allergies indicates no known allergies.  Home Medications   Prior to Admission medications   Medication Sig Start Date End Date Taking?  Authorizing Provider  acetaminophen (TYLENOL) 500 MG tablet Take 500 mg by mouth every 6 (six) hours as needed for moderate pain (2 tablets for severe pain).     Historical Provider, MD  baclofen (LIORESAL) 10 MG tablet Take 5 mg by mouth daily as needed for muscle spasms. Give 5 mg TID PRN Muscle Spasms    Historical Provider, MD  clopidogrel (PLAVIX) 75 MG tablet Take 75 mg by mouth daily.    Historical Provider, MD  docusate sodium 100 MG CAPS Take 100 mg by mouth 2 (two) times daily. 01/28/13   Belkys A Regalado, MD  dorzolamide (TRUSOPT) 2 % ophthalmic solution Place 1 drop into both eyes 2 (two) times daily.    Historical Provider, MD  latanoprost (XALATAN) 0.005 % ophthalmic solution Place 1 drop into both eyes at bedtime.     Historical Provider, MD  LORazepam (ATIVAN) 2 MG/ML concentrated solution Take 0.5 mg by mouth 2 (two) times daily as needed for anxiety.     Historical Provider, MD  metoprolol tartrate (LOPRESSOR) 25 MG tablet Take 12.5 mg by mouth 2 (two) times daily.    Historical Provider, MD  Multiple Vitamins-Minerals (DECUBI-VITE) CAPS Take 1 capsule by mouth daily.     Historical Provider, MD  polyethylene glycol (MIRALAX / GLYCOLAX) packet Take 17 g by mouth every other day.     Historical  Provider, MD  QUEtiapine (SEROQUEL) 100 MG tablet Take 100 mg by mouth daily.     Historical Provider, MD  QUEtiapine (SEROQUEL) 50 MG tablet Take 50 mg by mouth at bedtime.    Historical Provider, MD  sertraline (ZOLOFT) 50 MG tablet Take 50 mg by mouth at bedtime.    Historical Provider, MD  sodium chloride (MURO 128) 5 % ophthalmic ointment Place 1 application into both eyes every 6 (six) hours as needed for irritation.    Historical Provider, MD  UNABLE TO FIND Med Name: House Supplement - Give two mighty shakes with lunch and dinner.    Historical Provider, MD   BP   Pulse 0  Temp(Src) 95.9 F (35.5 C) (Axillary)  Resp 0  SpO2 18% Physical Exam  Constitutional:  thin  HENT:   Head: Normocephalic and atraumatic.  Eyes:  Pupils 2mm, non-reactive bilaterally  Neck: No tracheal deviation present.  Cardiovascular:  asystole  Pulmonary/Chest:  King airway in place  Abdominal: Soft. She exhibits no distension.  Musculoskeletal:  Extremities grossly atraumatic  Neurological:  unresponsive  Skin: Skin is warm and dry.    ED Course  Procedures (including critical care time) Labs Review Labs Reviewed - No data to display  Imaging Review No results found. I have personally reviewed and evaluated these images and lab results as part of my medical decision-making.   EKG Interpretation None         EMERGENCY DEPARTMENT [place] CARDIAC EXAM "Study: Limited Ultrasound of the heart and pericardium"  INDICATIONS:Cardiac arrest Multiple views of the heart and pericardium are obtained with a multi-frequency probe.  PERFORMED ZO:[id number]  IMAGES ARCHIVED?: No  FINDINGS: No cardiac activity  LIMITATIONS:  Emergent procedure  VIEWS USED: Parasternal long axis  INTERPRETATION: Cardiac activity absent  COMMENT:  None   MDM   Final diagnoses:  Cardiac arrest (HCC)    CPR in progress on patient arrival to the ED. Initial rhythm is asystole. Multiple rounds of epinephrine and CPR performed with asystole present on each rhythm check. No cardiac activity on ultrasound. Patient given bicarbonate and calcium. Pupils 2 mm and nonreactive bilaterally. No purposeful movements. After multiple rounds of CPR without improvement, resuscitative measures were discontinued at 1727.    Ayannah Faddis Ernestina Penna, MD 08-01-15 1737  Pricilla Loveless, MD 07/16/15 [phone]

## 2015-08-11 NOTE — Progress Notes (Signed)
   2015-07-03 1847  Clinical Encounter Type  Visited With Family  Visit Type Death  Referral From Nurse  Spiritual Encounters  Spiritual Needs Grief support  Stress Factors  Family Stress Factors Loss  Chaplain was called for two elderly women visitors in waiting room who were here to see a woman who had passed away. Chaplain escorted them to consult room and brought doctor to tell them news. One was the patient's daughter, one her cousin. Stayed with them and took them to see their mother. Got necessary information for ED staff. Azariel Banik, Chaplain

## 2015-08-11 DEATH — deceased

## 2015-10-26 IMAGING — CR DG PELVIS 1-2V
1 series · 1 of 1 positions shown · non-contrast
Comparison: 04/30/2013

CLINICAL DATA: Fall, bilateral hip discomfort

EXAM:
PELVIS - 1-2 VIEW

[t pelvis ap]
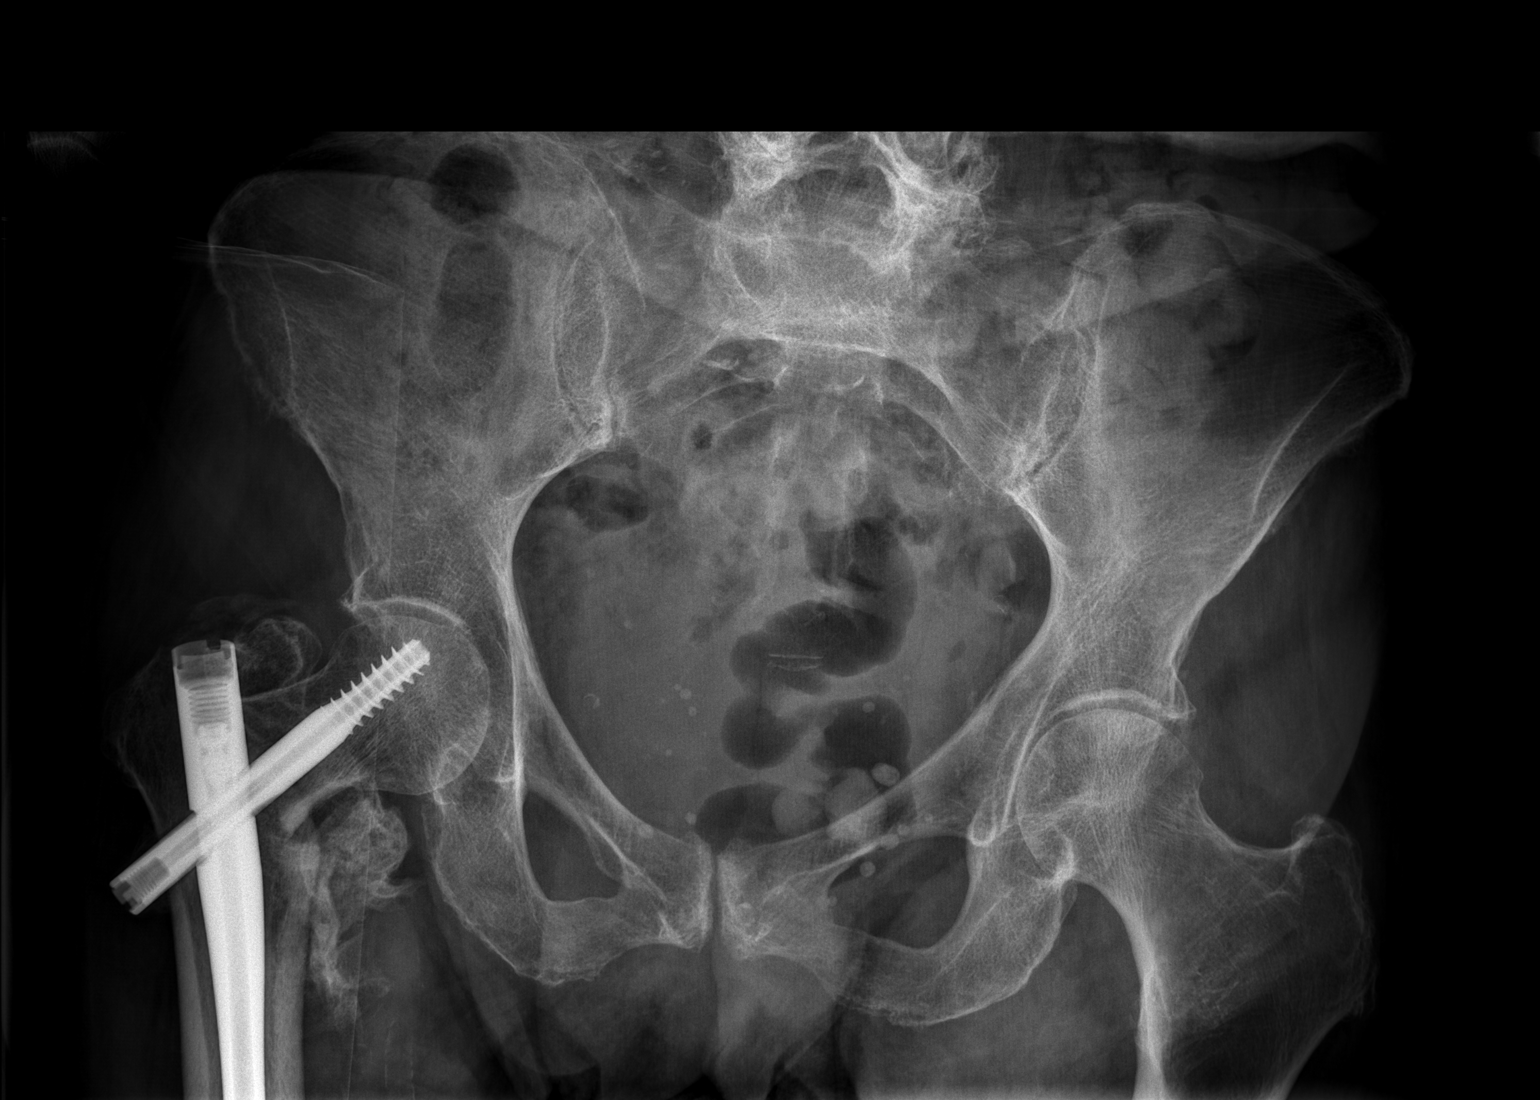

[1 of 1 positions shown; findings below may reference images not displayed]

FINDINGS: No fracture or dislocation is seen.

Status post ORIF of a prior intertrochanteric right hip fracture.
Associated heterotopic calcification. No evidence of hardware
complication.

Visualized bony pelvis appears intact.

Degenerative changes of the lower lumbar spine.
IMPRESSION: No fracture or dislocation is seen.

Prior ORIF of the right hip.

## 2015-12-12 IMAGING — CR DG CHEST 2V
2 series · 2 of 2 positions shown · non-contrast
Comparison: 06/10/2013

CLINICAL DATA: Chest pain

EXAM:
CHEST  2 VIEW

[x chest ap]
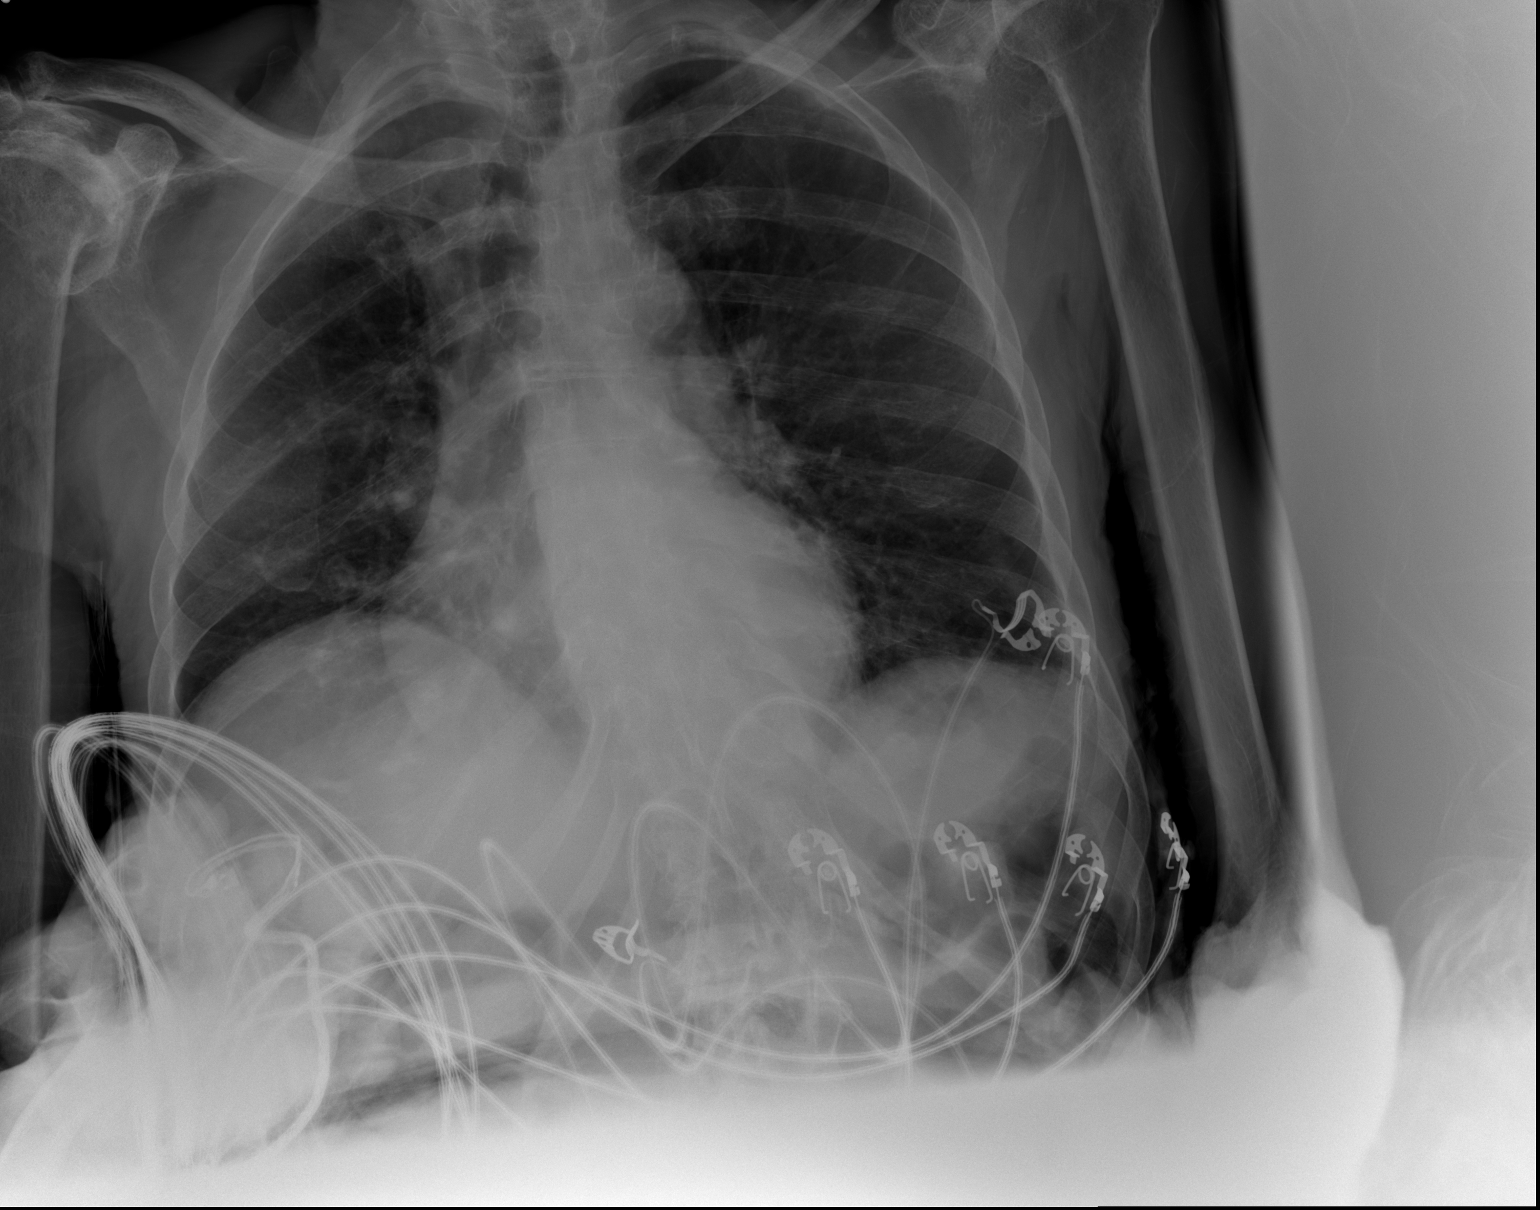

[w chest lat]
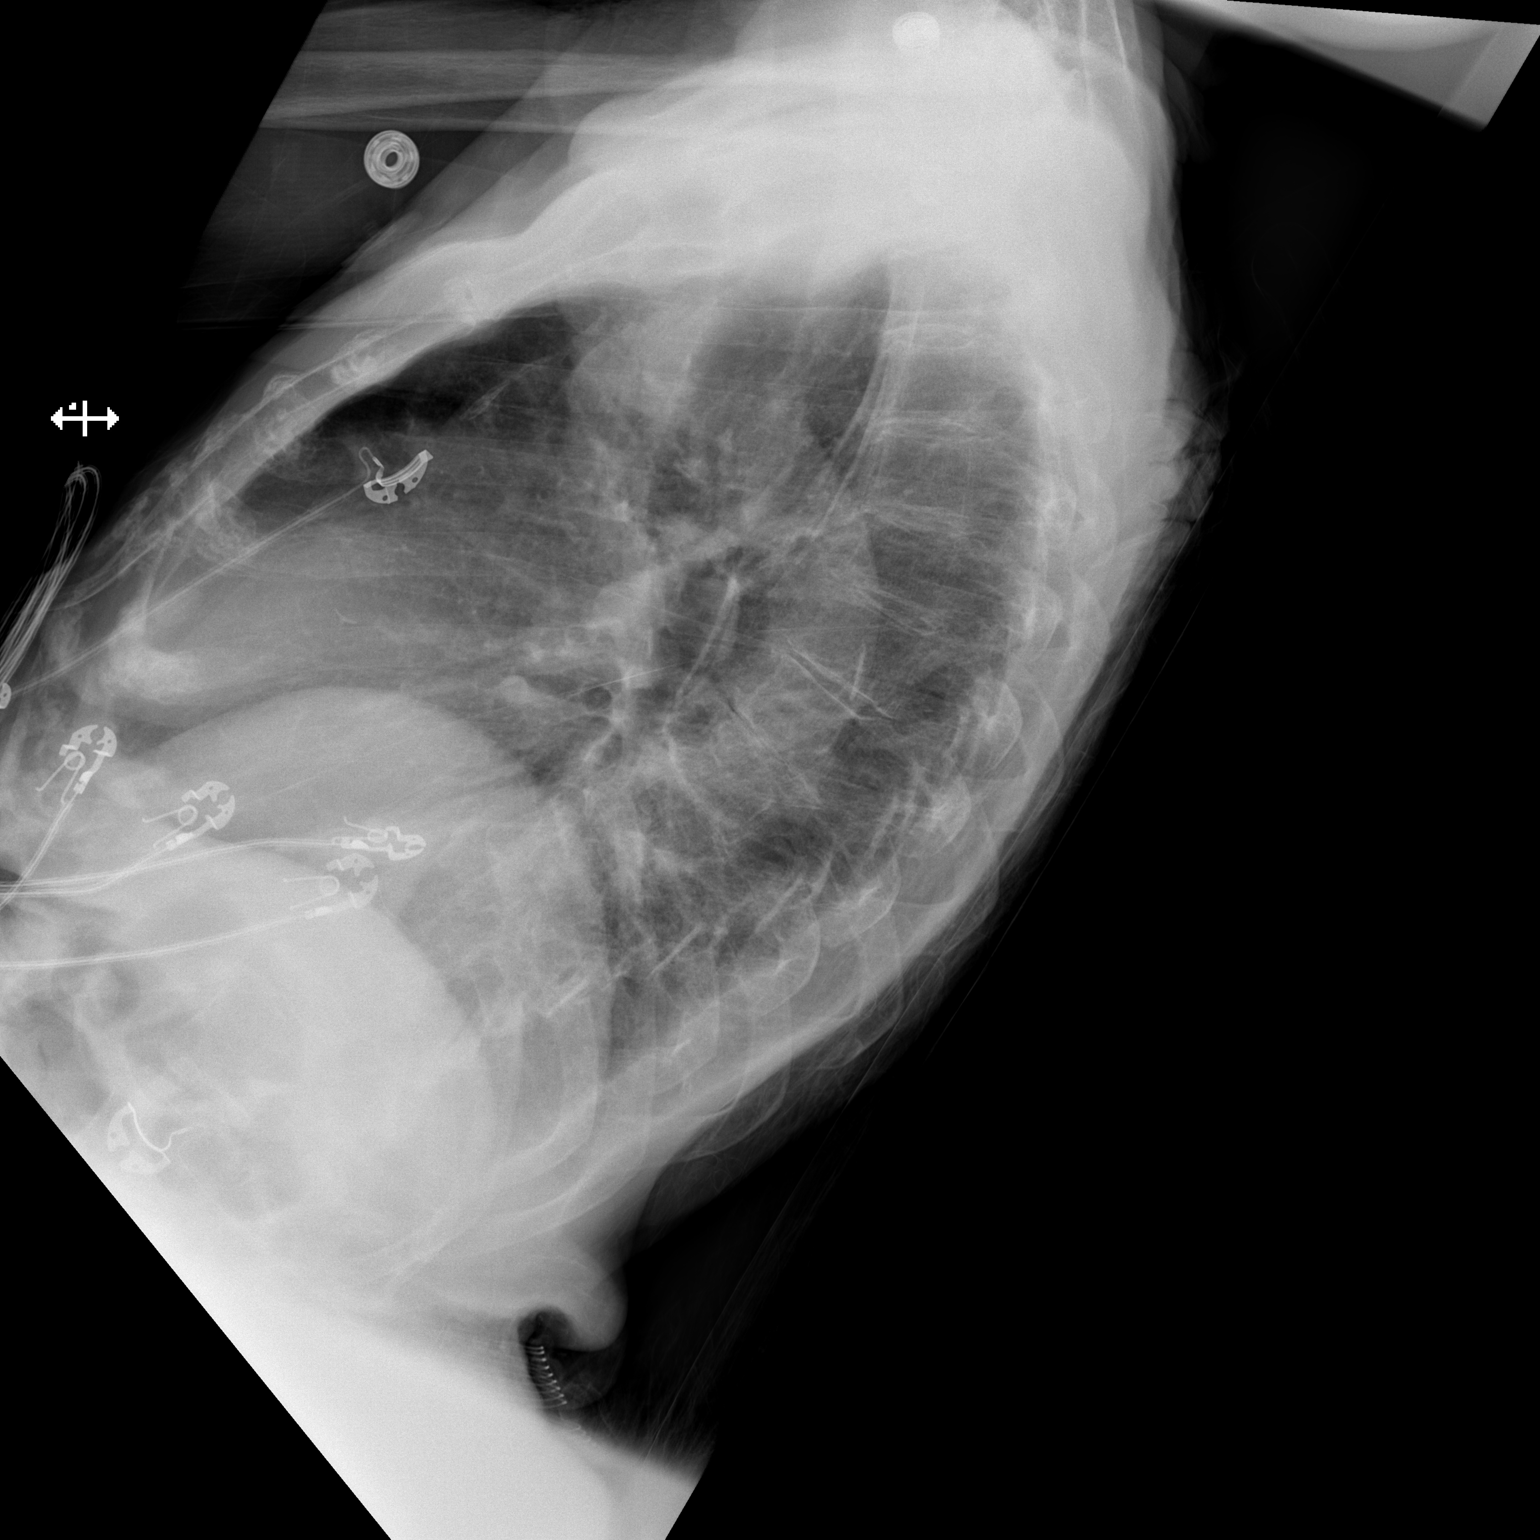

[2 of 2 positions shown; findings below may reference images not displayed]

FINDINGS: Patient is rotated.

Lungs are essentially clear. No focal consolidation. No pleural
effusion or pneumothorax.

The heart is normal in size.

Degenerative changes of the visualized thoracolumbar spine.

Moderate to severe degenerative changes of the right shoulder.
IMPRESSION: No evidence of acute cardiopulmonary disease.

## 2016-08-03 IMAGING — US US SOFT TISSUE HEAD/NECK
1 series · 13 of 25 positions shown · non-contrast
Comparison: None.

CLINICAL DATA: Thyroid nodule. History of left-sided thyroidectomy.

EXAM:
THYROID ULTRASOUND
TECHNIQUE: Ultrasound examination of the thyroid gland and adjacent soft
tissues was performed.

[Series 1: us soft tissue head/neck · 0.08mm/px · 13 of 48 slices shown]
[im 1/48]
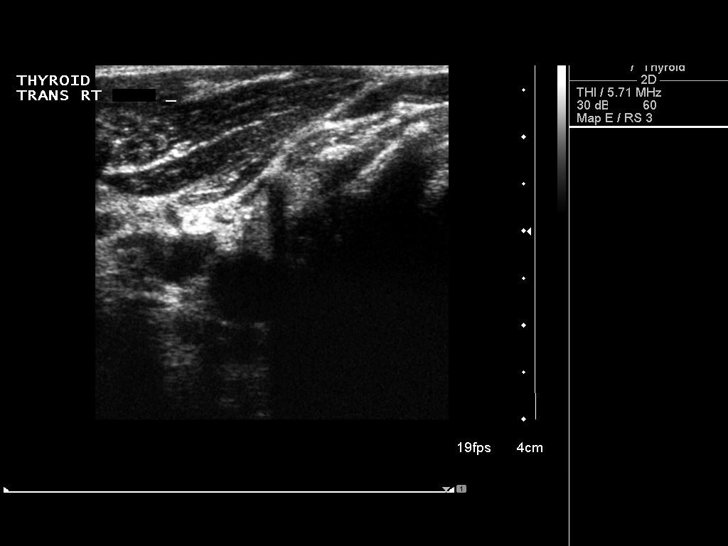
[im 4/48]
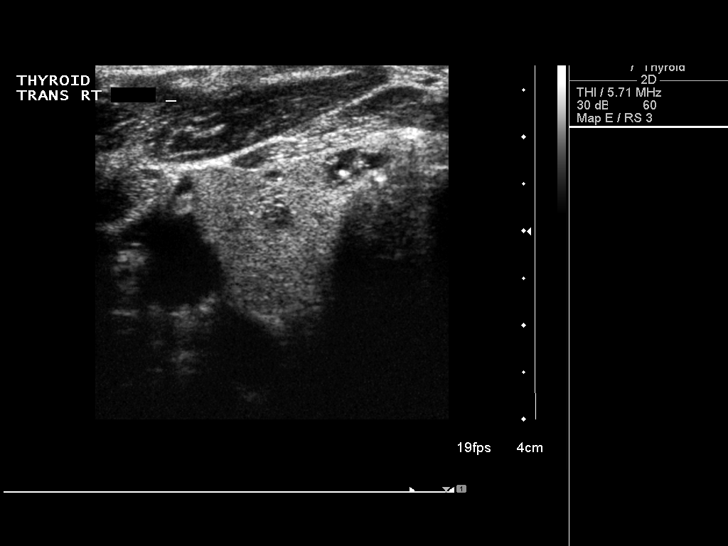
[im 8/48]
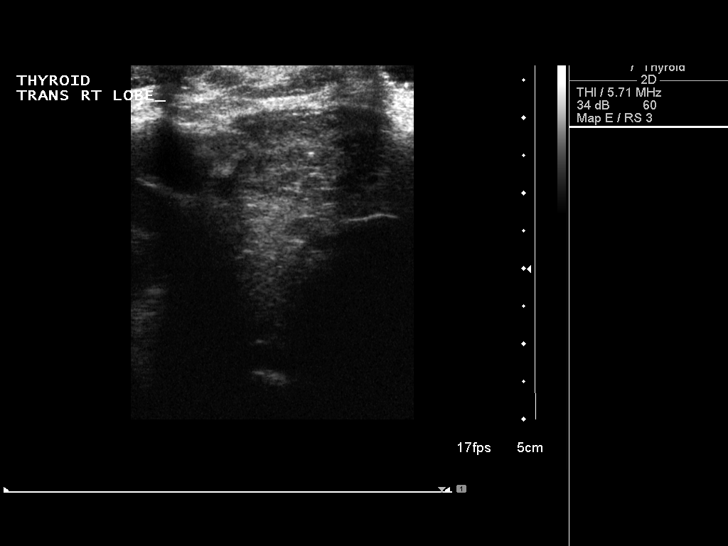
[im 12/48]
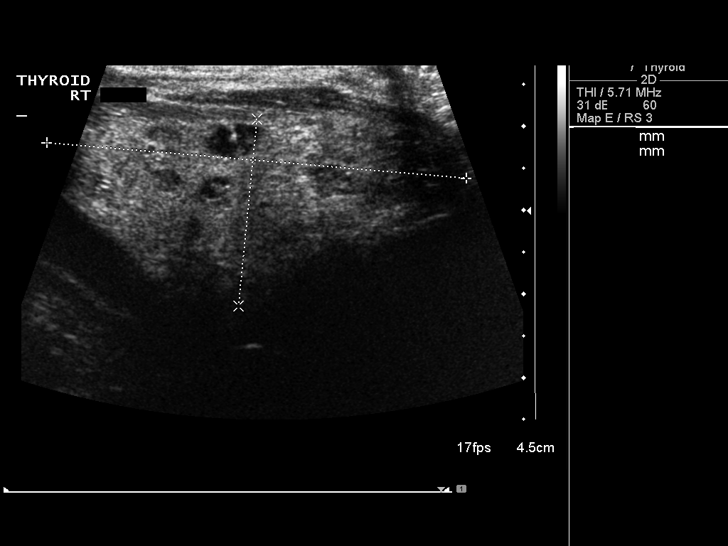
[im 16/48]
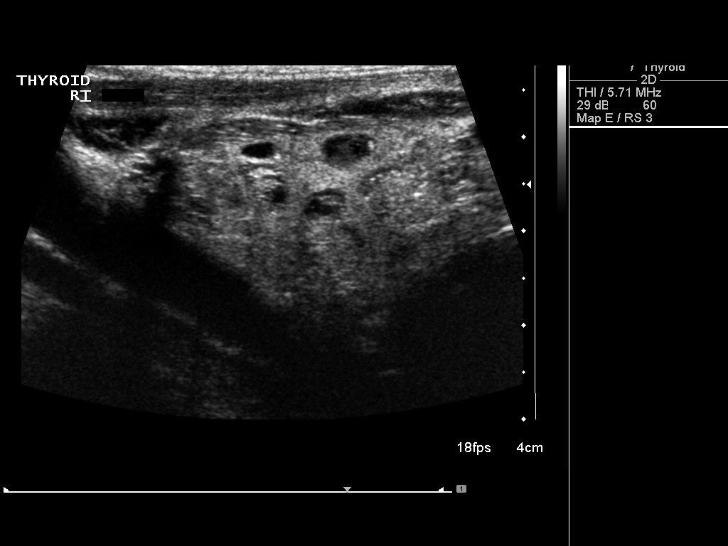
[im 20/48]
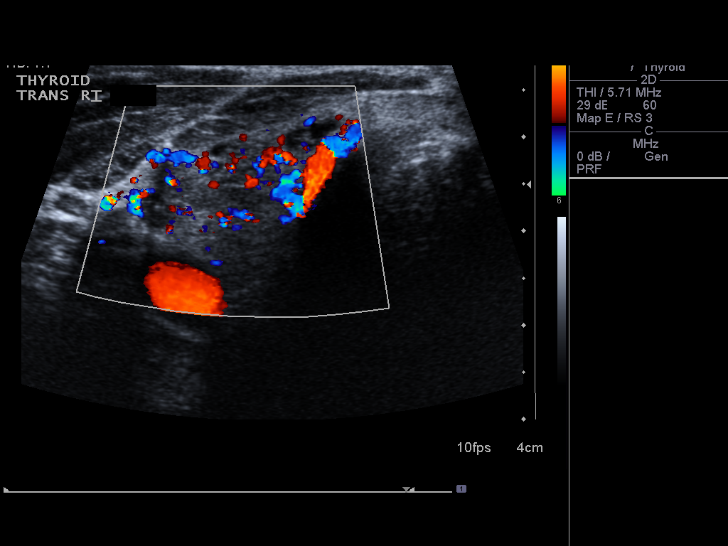
[im 24/48]
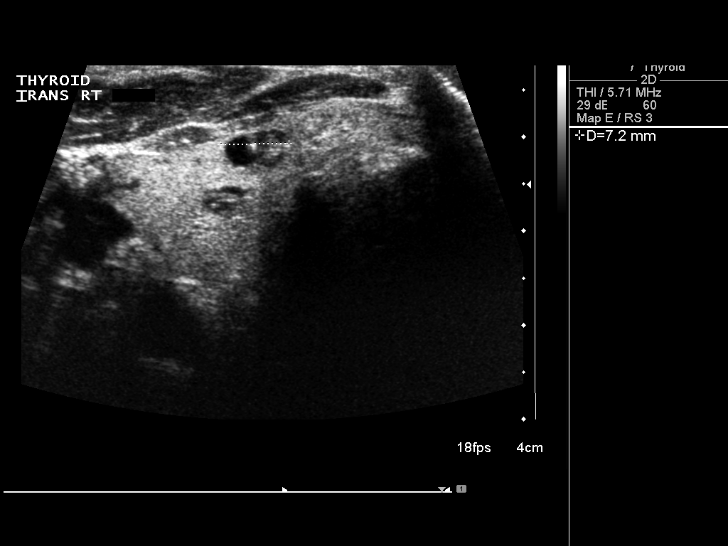
[im 28/48]
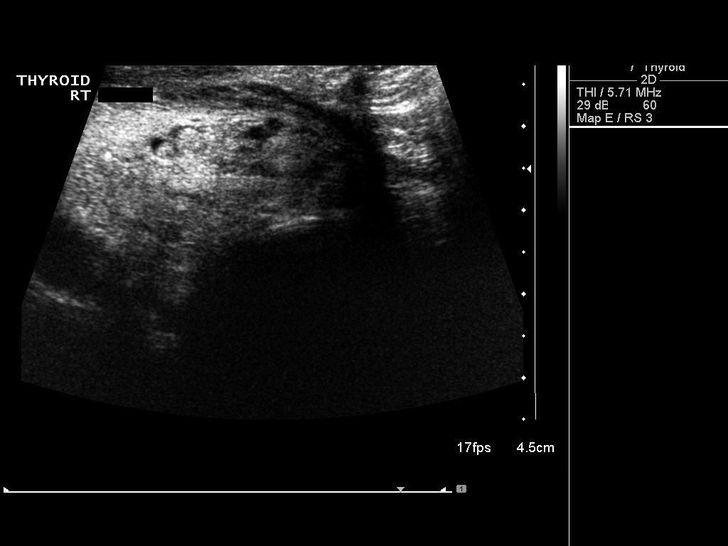
[im 32/48]
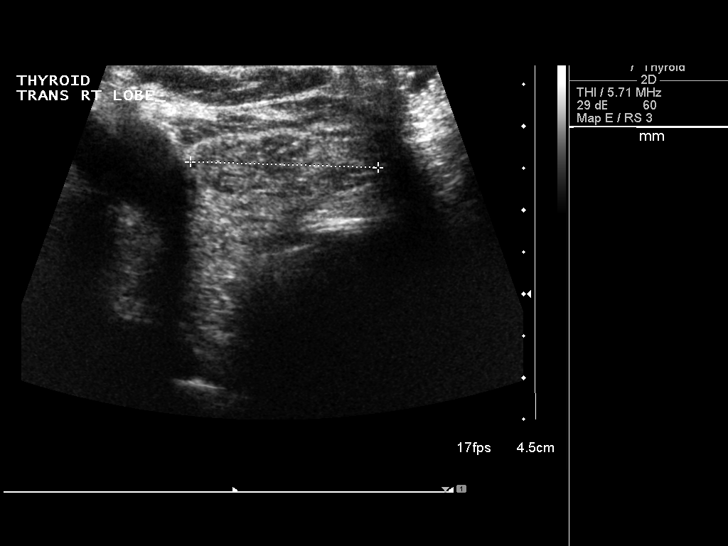
[im 36/48]
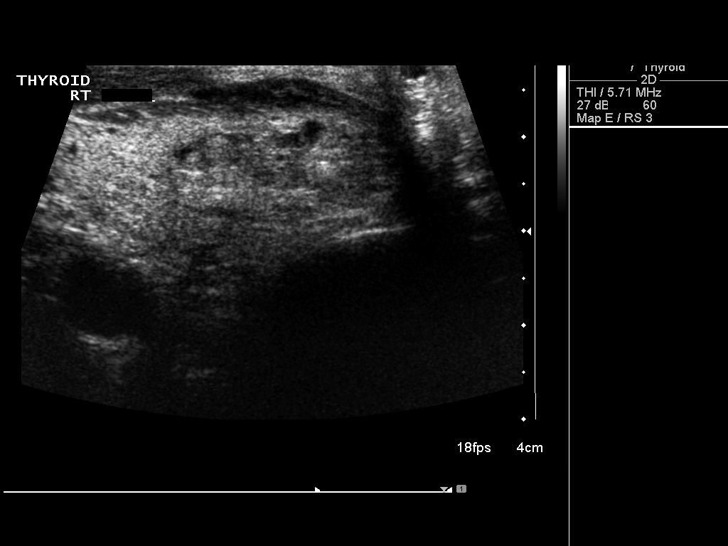
[im 40/48]
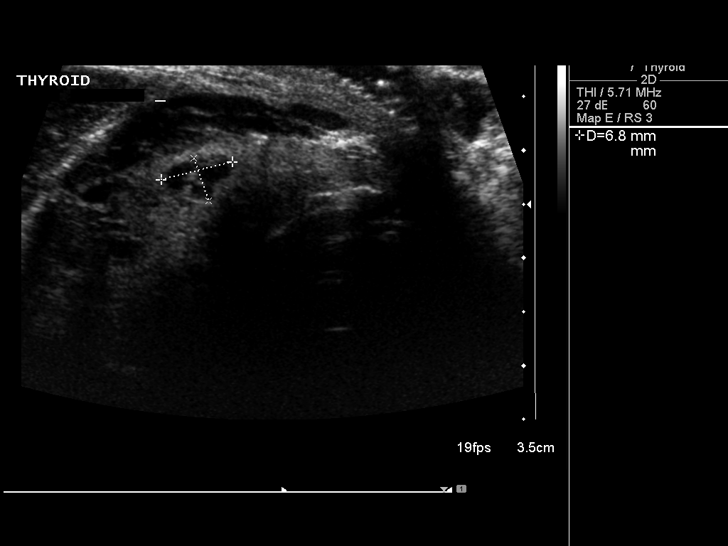
[im 44/48]
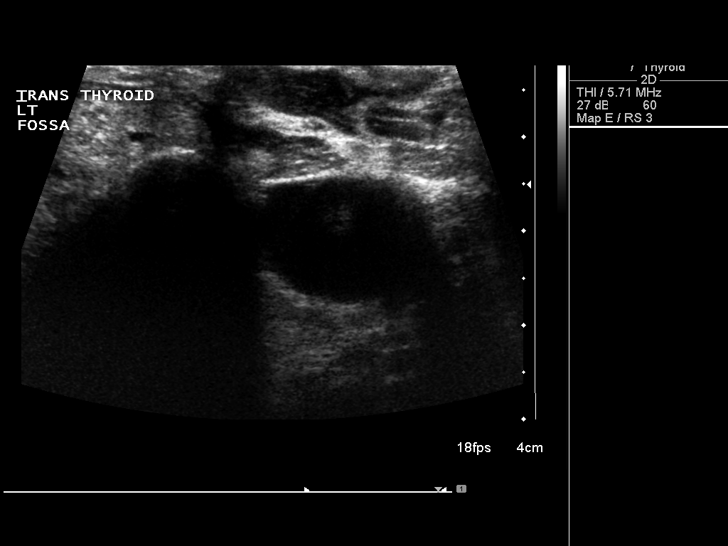
[im 48/48]
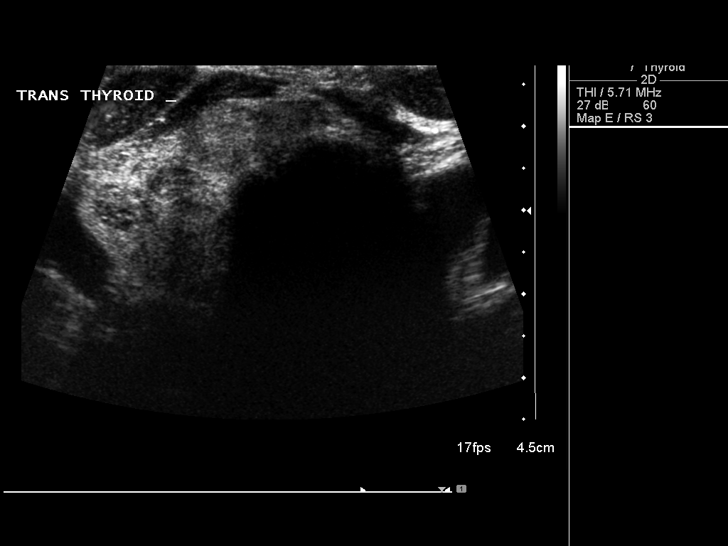

[13 of 25 positions shown; findings below may reference images not displayed]

FINDINGS: There is diffuse heterogeneity of the thyroid parenchymal
echotexture.

Right thyroid lobe

Measurements: Normal in size measuring 5.0 x 2.2 x 2.4 sternum.

Right, superior - 1.3 x 0.8 x 1.0 cm-mixed echogenic, partially
cystic, predominantly solid.

Right, mid, anterior - 0.7 x 0.5 x 0.7 cm-anechoic with peripheral
echogenic nodules with ring down artifact suggestive of colloid.

*Right, inferior - 3.1 x 1.8 x 2.2 cm - mixed echogenic, partially
cystic, predominantly solid, ill-defined, possibly a pseudo nodule

Left thyroid lobe

Surgically absent. There is no residual nodular soft tissue within
the left thyroidectomy bed.

Isthmus

Thickness: Normal in size measuring 0.2 cm in diameter..

Right - 0.7 x 0.4 x 0.5 cm - mixed echogenic, largely
anechoic/cystic with internal echogenic nodule with ring down
artifact suggestive of colloid.

Lymphadenopathy

None visualized.
IMPRESSION: 1. Post left thyroidectomy without residual soft tissue within the
thyroidectomy bed.
2. Findings compatible with multi nodular goiter with the remaining
thyroid parenchyma.
3. Dominant approximately 3.1 cm complex nodule within the inferior
aspect of the right lobe of the thyroid - comparison with prior
outside thyroid ultrasounds (if available) is recommended.
Technically this nodule meets imaging criteria to recommend
percutaneous sampling though given the patient's advanced age, an
additional imaging strategy to include a follow-up thyroid
ultrasound in 6-12 months.
This recommendation follows the consensus statement: Management of
Thyroid Nodules Detected at US: Society of Radiologists in
Ultrasound Consensus Conference Statement. Radiology 1114;
